# Patient Record
Sex: Female | Born: 1935 | Race: White | Hispanic: No | Marital: Married | Smoking: Never smoker
Health system: Southern US, Community
[De-identification: ages and names within clinical notes are randomized; demographics above are authoritative.]

## PROBLEM LIST (undated history)

## (undated) DIAGNOSIS — N3281 Overactive bladder: Secondary | ICD-10-CM

## (undated) DIAGNOSIS — I2510 Atherosclerotic heart disease of native coronary artery without angina pectoris: Secondary | ICD-10-CM

## (undated) DIAGNOSIS — IMO0002 Reserved for concepts with insufficient information to code with codable children: Secondary | ICD-10-CM

## (undated) DIAGNOSIS — I219 Acute myocardial infarction, unspecified: Secondary | ICD-10-CM

## (undated) DIAGNOSIS — K5730 Diverticulosis of large intestine without perforation or abscess without bleeding: Secondary | ICD-10-CM

## (undated) DIAGNOSIS — E119 Type 2 diabetes mellitus without complications: Secondary | ICD-10-CM

## (undated) DIAGNOSIS — G309 Alzheimer's disease, unspecified: Secondary | ICD-10-CM

## (undated) DIAGNOSIS — F0280 Dementia in other diseases classified elsewhere without behavioral disturbance: Secondary | ICD-10-CM

## (undated) DIAGNOSIS — I10 Essential (primary) hypertension: Secondary | ICD-10-CM

## (undated) DIAGNOSIS — E785 Hyperlipidemia, unspecified: Secondary | ICD-10-CM

## (undated) DIAGNOSIS — K222 Esophageal obstruction: Secondary | ICD-10-CM

## (undated) DIAGNOSIS — K219 Gastro-esophageal reflux disease without esophagitis: Secondary | ICD-10-CM

## (undated) HISTORY — DX: Atherosclerotic heart disease of native coronary artery without angina pectoris: I25.10

## (undated) HISTORY — DX: Hyperlipidemia, unspecified: E78.5

## (undated) HISTORY — DX: Acute myocardial infarction, unspecified: I21.9

## (undated) HISTORY — DX: Type 2 diabetes mellitus without complications: E11.9

## (undated) HISTORY — DX: Gastro-esophageal reflux disease without esophagitis: K21.9

## (undated) HISTORY — PX: BACK SURGERY: SHX140

## (undated) HISTORY — PX: APPENDECTOMY: SHX54

## (undated) HISTORY — DX: Essential (primary) hypertension: I10

## (undated) HISTORY — DX: Esophageal obstruction: K22.2

## (undated) HISTORY — DX: Reserved for concepts with insufficient information to code with codable children: IMO0002

## (undated) HISTORY — PX: OTHER SURGICAL HISTORY: SHX169

## (undated) HISTORY — PX: CHOLECYSTECTOMY: SHX55

## (undated) HISTORY — DX: Overactive bladder: N32.81

## (undated) HISTORY — DX: Diverticulosis of large intestine without perforation or abscess without bleeding: K57.30

## (undated) HISTORY — PX: BREAST BIOPSY: SHX20

## (undated) HISTORY — PX: TONSILLECTOMY AND ADENOIDECTOMY: SUR1326

---

## 1978-04-10 DIAGNOSIS — I219 Acute myocardial infarction, unspecified: Secondary | ICD-10-CM

## 1978-04-10 HISTORY — DX: Acute myocardial infarction, unspecified: I21.9

## 1997-10-13 ENCOUNTER — Other Ambulatory Visit: Admission: RE | Admit: 1997-10-13 | Discharge: 1997-10-13 | Payer: Self-pay | Admitting: Cardiology

## 1998-09-28 ENCOUNTER — Other Ambulatory Visit: Admission: RE | Admit: 1998-09-28 | Discharge: 1998-09-28 | Payer: Self-pay | Admitting: Cardiology

## 1999-12-19 ENCOUNTER — Encounter: Admission: RE | Admit: 1999-12-19 | Discharge: 1999-12-19 | Payer: Self-pay | Admitting: Internal Medicine

## 1999-12-19 ENCOUNTER — Encounter: Payer: Self-pay | Admitting: Internal Medicine

## 1999-12-23 ENCOUNTER — Encounter: Admission: RE | Admit: 1999-12-23 | Discharge: 1999-12-23 | Payer: Self-pay | Admitting: Internal Medicine

## 1999-12-23 ENCOUNTER — Encounter: Payer: Self-pay | Admitting: Internal Medicine

## 2000-12-24 ENCOUNTER — Encounter: Payer: Self-pay | Admitting: Internal Medicine

## 2000-12-24 ENCOUNTER — Encounter: Admission: RE | Admit: 2000-12-24 | Discharge: 2000-12-24 | Payer: Self-pay | Admitting: Internal Medicine

## 2001-04-04 ENCOUNTER — Encounter: Payer: Self-pay | Admitting: Orthopedic Surgery

## 2001-04-04 ENCOUNTER — Ambulatory Visit (HOSPITAL_COMMUNITY): Admission: RE | Admit: 2001-04-04 | Discharge: 2001-04-04 | Payer: Self-pay | Admitting: Orthopedic Surgery

## 2001-11-27 ENCOUNTER — Ambulatory Visit: Admission: RE | Admit: 2001-11-27 | Discharge: 2001-11-27 | Payer: Self-pay | Admitting: Internal Medicine

## 2001-12-03 ENCOUNTER — Encounter: Admission: RE | Admit: 2001-12-03 | Discharge: 2002-03-03 | Payer: Self-pay | Admitting: Internal Medicine

## 2002-01-17 ENCOUNTER — Encounter: Admission: RE | Admit: 2002-01-17 | Discharge: 2002-01-17 | Payer: Self-pay | Admitting: Internal Medicine

## 2002-01-17 ENCOUNTER — Encounter: Payer: Self-pay | Admitting: Internal Medicine

## 2002-01-28 ENCOUNTER — Encounter: Admission: RE | Admit: 2002-01-28 | Discharge: 2002-01-28 | Payer: Self-pay | Admitting: Internal Medicine

## 2002-01-28 ENCOUNTER — Encounter: Payer: Self-pay | Admitting: Internal Medicine

## 2002-01-28 ENCOUNTER — Encounter (INDEPENDENT_AMBULATORY_CARE_PROVIDER_SITE_OTHER): Payer: Self-pay | Admitting: Specialist

## 2003-02-02 ENCOUNTER — Encounter: Payer: Self-pay | Admitting: Internal Medicine

## 2003-02-02 ENCOUNTER — Encounter: Admission: RE | Admit: 2003-02-02 | Discharge: 2003-02-02 | Payer: Self-pay | Admitting: Internal Medicine

## 2003-07-31 ENCOUNTER — Inpatient Hospital Stay (HOSPITAL_COMMUNITY): Admission: RE | Admit: 2003-07-31 | Discharge: 2003-08-04 | Payer: Self-pay | Admitting: Orthopedic Surgery

## 2004-12-26 ENCOUNTER — Encounter: Admission: RE | Admit: 2004-12-26 | Discharge: 2004-12-26 | Payer: Self-pay | Admitting: Internal Medicine

## 2005-12-28 ENCOUNTER — Encounter: Admission: RE | Admit: 2005-12-28 | Discharge: 2005-12-28 | Payer: Self-pay | Admitting: Internal Medicine

## 2006-12-31 ENCOUNTER — Encounter: Admission: RE | Admit: 2006-12-31 | Discharge: 2006-12-31 | Payer: Self-pay | Admitting: Internal Medicine

## 2007-08-27 DIAGNOSIS — K649 Unspecified hemorrhoids: Secondary | ICD-10-CM | POA: Insufficient documentation

## 2007-08-27 DIAGNOSIS — K219 Gastro-esophageal reflux disease without esophagitis: Secondary | ICD-10-CM

## 2007-08-27 DIAGNOSIS — K5730 Diverticulosis of large intestine without perforation or abscess without bleeding: Secondary | ICD-10-CM

## 2007-08-27 DIAGNOSIS — K449 Diaphragmatic hernia without obstruction or gangrene: Secondary | ICD-10-CM | POA: Insufficient documentation

## 2007-08-29 ENCOUNTER — Ambulatory Visit: Payer: Self-pay | Admitting: Internal Medicine

## 2007-08-29 DIAGNOSIS — I2510 Atherosclerotic heart disease of native coronary artery without angina pectoris: Secondary | ICD-10-CM | POA: Insufficient documentation

## 2007-08-29 DIAGNOSIS — E785 Hyperlipidemia, unspecified: Secondary | ICD-10-CM | POA: Insufficient documentation

## 2007-08-29 DIAGNOSIS — R1319 Other dysphagia: Secondary | ICD-10-CM

## 2007-08-29 DIAGNOSIS — I10 Essential (primary) hypertension: Secondary | ICD-10-CM

## 2007-08-30 ENCOUNTER — Ambulatory Visit: Payer: Self-pay | Admitting: Internal Medicine

## 2008-01-01 ENCOUNTER — Encounter: Admission: RE | Admit: 2008-01-01 | Discharge: 2008-01-01 | Payer: Self-pay | Admitting: Internal Medicine

## 2009-01-05 ENCOUNTER — Encounter: Admission: RE | Admit: 2009-01-05 | Discharge: 2009-01-05 | Payer: Self-pay | Admitting: Internal Medicine

## 2009-09-21 ENCOUNTER — Ambulatory Visit (HOSPITAL_BASED_OUTPATIENT_CLINIC_OR_DEPARTMENT_OTHER): Admission: RE | Admit: 2009-09-21 | Discharge: 2009-09-21 | Payer: Self-pay | Admitting: Orthopedic Surgery

## 2009-11-10 ENCOUNTER — Ambulatory Visit: Payer: Self-pay | Admitting: Cardiology

## 2009-11-11 ENCOUNTER — Telehealth (INDEPENDENT_AMBULATORY_CARE_PROVIDER_SITE_OTHER): Payer: Self-pay | Admitting: *Deleted

## 2009-11-15 ENCOUNTER — Ambulatory Visit: Payer: Self-pay

## 2009-11-15 ENCOUNTER — Encounter (HOSPITAL_COMMUNITY): Admission: RE | Admit: 2009-11-15 | Discharge: 2009-12-27 | Payer: Self-pay | Admitting: Cardiology

## 2009-11-15 ENCOUNTER — Ambulatory Visit: Payer: Self-pay | Admitting: Internal Medicine

## 2009-11-15 ENCOUNTER — Encounter: Payer: Self-pay | Admitting: Internal Medicine

## 2010-03-14 ENCOUNTER — Encounter: Admission: RE | Admit: 2010-03-14 | Discharge: 2010-03-14 | Payer: Self-pay | Admitting: Internal Medicine

## 2010-04-30 ENCOUNTER — Encounter: Payer: Self-pay | Admitting: Internal Medicine

## 2010-05-10 NOTE — Assessment & Plan Note (Signed)
Summary: Cardiology Nuclear Testing  Nuclear Med Background Indications for Stress Test: Evaluation for Ischemia   History: Heart Catheterization, Myocardial Infarction, Myocardial Perfusion Study  History Comments: '83 MI>Cath: no record of results. 5/09 MPS:NL.  Symptoms: DOE    Nuclear Pre-Procedure Cardiac Risk Factors: Hypertension, Lipids Caffeine/Decaff Intake: none NPO After: 7:00 PM Lungs: Clear IV 0.9% NS with Angio Cath: 20g     IV Site: (R) AC IV Started by: Stanton Kidney EMT-P Chest Size (in) 34     Cup Size B     Height (in): 61 Weight (lb): 135 BMI: 25.60  Nuclear Med Study 1 or 2 day study:  1 day     Stress Test Type:  Lexiscan Low Level Reading MD:  Dietrich Pates, MD     Referring MD:  Delfin Edis Resting Radionuclide:  Technetium 79m Tetrofosmin     Resting Radionuclide Dose:  11.0 mCi  Stress Radionuclide:  Technetium 25m Tetrofosmin     Stress Radionuclide Dose:  33.0 mCi   Stress Protocol      Max HR:  110 bpm     Predicted Max HR:  145 bpm  Max Systolic BP: 185 mm Hg     Percent Max HR:  75.86 %Rate Pressure Product:  16109  Lexiscan: 0.4 mg   Stress Test Technologist:  Irean Hong RN     Nuclear Technologist:  Harlow Asa CNMT  Rest Procedure  Myocardial perfusion imaging was performed at rest 45 minutes following the intravenous administration of Myoview Technetium 69m Tetrofosmin.  Stress Procedure  The patient received IV Lexiscan 0.4 mg over 15-seconds with concurrent low level exercise, and then myoview was  injected at 30-seconds while the patient continued walking one more minute.  There were nonspecific changes with infusion.  Quantitative spect images were obtained after a 45 minute delay.  QPS Raw Data Images:  Soft tissue (diaphragm, breast) surround heart. Stress Images:  There is normal uptake in all areas. Rest Images:  Normal homogeneous uptake in all areas of the myocardium. Subtraction (SDS):  No evidence of  ischemia. Transient Ischemic Dilatation:  1.10  (Normal <1.22)  Lung/Heart Ratio:  .26  (Normal <0.45)  Quantitative Gated Spect Images QGS EDV:  49 ml QGS ESV:  12 ml QGS EF:  75 %   Overall Impression  Exercise Capacity: Lexiscan protocol BP Response: Hypertensive blood pressure response. Clinical Symptoms: Chest burning. ECG Impression: No significant ST segment change suggestive of ischemia. Overall Impression: Normal stress nuclear study.

## 2010-05-10 NOTE — Progress Notes (Signed)
Summary: Nuclear pre procedure  Phone Note Outgoing Call Call back at [facility] Phone [phone]   Call placed by: Rea College, CMA,  November 11, 2009 3:25 PM Call placed to: Patient Summary of Call: Reviewed information on Myoview Information Sheet (see scanned document for further details).  Spoke with patient.      Nuclear Med Background Indications for Stress Test: Evaluation for Ischemia   History: Heart Catheterization, Myocardial Infarction      Nuclear Pre-Procedure Cardiac Risk Factors: Hypertension, Lipids Height (in): 61

## 2010-05-18 ENCOUNTER — Encounter: Payer: Medicare Other | Admitting: Obstetrics & Gynecology

## 2010-05-18 ENCOUNTER — Other Ambulatory Visit: Payer: Self-pay | Admitting: Obstetrics & Gynecology

## 2010-05-18 DIAGNOSIS — D4959 Neoplasm of unspecified behavior of other genitourinary organ: Secondary | ICD-10-CM

## 2010-06-01 ENCOUNTER — Ambulatory Visit: Payer: Medicare Other | Admitting: Obstetrics & Gynecology

## 2010-06-27 LAB — POCT HEMOGLOBIN-HEMACUE: Hemoglobin: 14.5 g/dL (ref 12.0–15.0)

## 2010-06-27 LAB — BASIC METABOLIC PANEL
Chloride: 106 mEq/L (ref 96–112)
GFR calc Af Amer: 56 mL/min — ABNORMAL LOW (ref >60)
Potassium: 3.9 mEq/L (ref 3.5–5.1)

## 2010-07-01 NOTE — Assessment & Plan Note (Signed)
NAME:  Janet Ward, SHELBURNE NO.:  [id number]  MEDICAL RECORD NO.:  [id number]          PATIENT TYPE:  LOCATION:  [facility] [place]           FACILITY:  PHYSICIAN:  Allie Bossier, MD             DATE OF BIRTH:  DATE OF SERVICE:  05/18/2010                                 CLINIC NOTE  [name] is a 77 year old married white G2, P2, her daughters are 36 and 45 years of age, and she comes in because she has complained of at least 6 months of vaginal redness, pain, itching, burning.  She saw her primary care doctor  at least 4 months ago and he gave her prescription for Premarin cream and some other prescription that she cannot remember the name for, she says that these did not help.  She has tried Vaseline with no help.  On exam today she has multiple erythematous excoriated and even white plaque areas depending on both labia minora and even a very red area near her urethra.  The inside of the vagina shows no lesions just severe atrophy.  I cleaned the entire area with Betadine and then use 1% lidocaine to numb an area on the left labia minora and just to the right of the perineum at the vaginal introitus, these areas were both biopsied.  She tolerated the procedure well.  Silver nitrate was used cauterize both areas.  She will return in a week for her results.  In the meantime, I have given a prescription for Temovate as I believe this is probably lichen sclerosus.     Allie Bossier, MD    MCD/MEDQ  D:  05/18/2010  T:  05/19/2010  Job:  [id number]

## 2010-07-06 ENCOUNTER — Ambulatory Visit: Payer: Medicare Other | Admitting: Obstetrics & Gynecology

## 2010-07-06 DIAGNOSIS — L940 Localized scleroderma [morphea]: Secondary | ICD-10-CM

## 2010-07-12 NOTE — Assessment & Plan Note (Signed)
NAME:  Janet Ward, Janet Ward NO.:  [id number]  MEDICAL RECORD NO.:  [id number]          PATIENT TYPE:  LOCATION:  [facility] at [place]           FACILITY:  PHYSICIAN:  Allie Bossier, MD             DATE OF BIRTH:  DATE OF SERVICE:  07/06/2010                                 CLINIC NOTE  Janet Ward is a 77 year old lady who has biopsy-proven lichen simplex chronicus with spongiosis at the last time I saw her which was on May 18, 2010.  I gave her a prescription for Temovate cream and told her to use that every other night.  She has been using it every night and says that feels much much better.  On exam, there is a dramatic improvement.  She still has severe atrophy. I have recommended that she use the Temovate cream no more than twice a week and make sure she has refills.  I have also given her prescription and sample for Estrace cream to be used 0.5 gram externally twice a week as well.  She understands this.  Her mammogram is up-to-date.  She will follow up in a year or sooner as necessary.     Allie Bossier, MD    MCD/MEDQ  D:  07/06/2010  T:  07/07/2010  Job:  [id number]

## 2010-08-26 NOTE — Discharge Summary (Signed)
NAME:  Janet Ward, SPEASE                          ACCOUNT NO.:  [id number]   MEDICAL RECORD NO.:  [id number]                   PATIENT TYPE:  INP   LOCATION:  0453                                 FACILITY:  [facility]   PHYSICIAN:  Ollen Gross, M.D.                 DATE OF BIRTH:  1931-11-24   DATE OF ADMISSION:  07/31/2003  DATE OF DISCHARGE:  08/04/2003                                 DISCHARGE SUMMARY   ADMISSION DIAGNOSES:  1. Osteoarthritis, left knee.  2. Hypertension.  3. History of myocardial infarction in 1983.  4. Hiatal hernia.  5. Hypercholesterolemia.  6. Postmenopausal.   DISCHARGE DIAGNOSES:  1. Osteoarthritis, left knee status post left total knee arthroplasty.  2. Postoperative blood loss anemia.  3. Postoperative hyponatremia improved.  4. Hypertension.  5. History of myocardial infarction in 1983.  6. Hiatal hernia.  7  Hypercholesterolemia.  8  Postmenopausal.   PROCEDURE:  The patient was taken to the OR on July 31, 2003 and underwent  a left total knee arthroplasty.  Surgeon was Ollen Gross, M.D., assistant  Alexzandrew L. Julien Girt, P.A.  Anesthesia was general with postop Marcaine  pain pump. Minimal blood loss.  Hemovac drain x1.  Tourniquet time of 41  minutes at 300 mmHg.   BRIEF HISTORY:  Ms. Janet Ward is a 77 year old female with end-stage arthritis  of the left knee, pain has been  refractory to nonoperative management who  now presents for a total knee arthroplasty.   LABORATORY DATA:  CBC on admission:  Hemoglobin of 13.7, hematocrit of 40.0,  white cell count 5.__________, differential within normal limits.  Postoperative H&H 9.9 and 28.8.  Hemoglobin continued to climb down to 8.6  and 25.1.  Last noted at 8.7 and 25.2.  PT and PTT preop 11.9 and 26  respectively with an INR of 0.8, serial pro times followed.  Last noted PT  and INR 20.7 and 2.3.  Chem panel on admission minimally elevated glucose of  123, the remaining chem panel all within  normal limits. Serial BMETs are  followed. Sodium dropped from 140 down to 133, back up to 135.  Calcium  dropped to from 10.1 to 8.0 back up to 8.4.  Preop UA: Trace leukocyte  esterase, 0-2 white cells, rare bacteria, otherwise negative.  Blood group  type A positive.   EKG dated July 24, 2003, normal sinus rhythm, left atrial enlargement, no  old tracings to compare. Dr. Othelia Pulling.  Chest x-ray two view on July 24, 2003, no active pulmonary disease, mild cardiomegaly.   HOSPITAL COURSE:  The patient was admitted to [facility], taken to  the OR, underwent the above stated procedure without complications.  The  patient tolerated the procedure well and later taken to the recovery room  and then to the orthopedic floor to continue postop care.  Vital signs were  followed. The patient was  given postoperative IV antibiotics in the form of  Ancef, placed on Coumadin, started back on home medications, placed  weightbearing as tolerated.  PT and OT were consulted postop.  A Hemovac  drain placed at the time of surgery was pulled on postoperative day one.  By  day two PCs, these were discontinued.  Dressing was changed, incision was  healing well. The patient did very well with physical therapy up ambulating  approximately 190 feet x day two and up to 200 feet by day three, weaned  over to p.o. medications, progressed well with physical therapy and by day  four she was doing so well it decided she could be discharged home at that  time.   PLAN:  1. The patient was discharged on August 04, 2003.  2. Discharge diagnoses please see above.  3. Discharge medications:     a. Percocet for pain.     b. Robaxin for spasm.     c. Coumadin for DVT prophylaxis.  4. Diet, low sodium, low cholesterol diet.  5. Activity weightbearing as tolerated.  Home health PT and home health     nursing. Total knee protocol.   FOLLOW UP:  Two weeks from surgery.   DISPOSITION:  Home.   CONDITION  ON DISCHARGE:  Improved.     Alexzandrew L. Julien Girt, P.A.              Ollen Gross, M.D.    ALP/MEDQ  D:  08/26/2003  T:  08/26/2003  Job:  [id number]   cc:   Loraine Leriche A. Waynard Edwards, M.D.  [address]  [place]  [place] [address]  Fax: [phone]

## 2010-08-26 NOTE — Op Note (Signed)
NAME:  Janet Ward, Janet Ward                          ACCOUNT NO.:  [id number]   MEDICAL RECORD NO.:  [id number]                   PATIENT TYPE:  INP   LOCATION:  0001                                 FACILITY:  [facility]   PHYSICIAN:  Ollen Gross, M.D.                 DATE OF BIRTH:  1932/03/18   DATE OF PROCEDURE:  07/31/2003  DATE OF DISCHARGE:                                 OPERATIVE REPORT   PREOPERATIVE DIAGNOSES:  Osteoarthritis left knee.   POSTOPERATIVE DIAGNOSES:  Osteoarthritis left knee.   PROCEDURE:  Left total knee arthroplasty.   SURGEON:  Ollen Gross, M.D.   ASSISTANT:  Alexzandrew L. Julien Girt, P.A.   ANESTHESIA:  General with postop Marcaine pump.   ESTIMATED BLOOD LOSS:  Minimal.   DRAINS:  Hemovac x1.   TOURNIQUET TIME:  41 minutes at 300 mmHg.   COMPLICATIONS:  None.   CONDITION:  Stable to recovery.   BRIEF CLINICAL NOTE:  Janet Ward is a 77 year old female with severe end-  stage osteoarthritis of the left knee with pain refractory to nonoperative  management.  She presents now for left total knee arthroplasty.   DESCRIPTION OF PROCEDURE:  After successful administration of general  anesthetic, a tourniquet was placed on the left thigh and left lower  extremity prepped and draped in the usual sterile fashion. Extremity was  wrapped in esmarch, knee flexed and tourniquet inflated to 300 mmHg. A  midline incision was made with a 10 blade through the subcutaneous tissue to  the level of the extensor mechanism. A fresh blade is used to make a medial  parapatellar arthrotomy and the soft tissue over the proximal medial tissue  subperiosteally elevated to the joint line with a knife and into the  semimembranosus bursa with a curved osteotome.  The lateral tissue was also  elevated with attention being paid to avoiding the patellar tendon on the  tibial tubercle.  The patella was everted, knee flexed 90 degrees and ACL  and PCL removed. The drill was used to  create a starting hole in the distal  femur and canal was irrigated. A 5 degree left valgus alignment guide is  placed and the block is pinned to remove 10 mm off the distal femur.  Distal  femoral resection is made with an oscillating saw. A sizing block is placed  and a size 2.5 is the most appropriate.  The rotation for the 2.5 block is  marked up the epicondylar axis.  The 2.5 cutting guide is placed and the  anterior and posterior cuts made.   The tibia is subluxed forward and the menisci are removed.  Extramedullary  tibial alignment guide is placed referencing proximally at the medial aspect  of the tibial tubercle and distally along the second metatarsal axis and  tibial crest.  The block is pinned to remove approximately 4 mm off the  deficient lateral side. Both  sides are somewhat deficient so we went and  measured off the lower side. Tibial resection is made with an oscillating  saw.  2.5 is the most appropriate tibial component and then the proximal  tibia is prepared for a 2.5 with the modular drill and keel punch.  Femoral  preparation is completed with the intercondylar and chamfer cuts.   Trial size 2.5 posterior stabilized femur, size 2.5 mobile bearing tibial  tray, a 10 mm posterior stabilized rotating platform insert trial are  placed.  Full extension is achieved with excellent varus and valgus balance  throughout full range of motion.  The patella was again everted, thickness  measured to be 24 mm, free hand resection taken to 14 mm, 38 template is  placed, lug holes are drilled, trial patella is placed and it tracks  normally.  The osteophytes are then removed off the posterior femur with the  trial in place.  All trials are removed and the cut bone surfaces are  prepared with pulsatile lavage.  The cement is mixed and once ready for  implantation, a size 2.5 mobile bearing tibial tray, size 2.5 posterior  stabilized femur and the 10 mm trial insert is placed. The 38  patella is  also placed and held with the clamp.  Once the cement is fully hardened then  the permanent 10 mm posterior stabilized rotating platform insert is placed  into the tibial tray.  The wound is copiously irrigated with antibiotic  solution and the extensor mechanism closed over a Hemovac drain with  interrupted #1 PDS.  The subcu is closed with interrupted 2-0 Vicryl.  Tourniquet is released with a total time of 41 minutes. Flexion against  gravity is 135 degrees. The subcu is closed with interrupted 2-0 Vicryl,  subcuticular with running 4-0 Monocryl. The catheter for the Marcaine pain  pump is placed.  Steri-Strips and a bulky sterile dressing applied. The pump  is hooked up.  The knee is then placed into a knee immobilizer and she is  awakened and transported to recovery in stable condition.                                               Ollen Gross, M.D.    FA/MEDQ  D:  07/31/2003  T:  08/01/2003  Job:  [id number]

## 2010-08-26 NOTE — H&P (Signed)
NAME:  Janet Ward, GUEDES                          ACCOUNT NO.:  [id number]   MEDICAL RECORD NO.:  [id number]                   PATIENT TYPE:  INP   LOCATION:  0453                                 FACILITY:  [facility]   PHYSICIAN:  Ollen Gross, M.D.                 DATE OF BIRTH:  Aug 05, 1931   DATE OF ADMISSION:  07/31/2003  DATE OF DISCHARGE:  08/04/2003                                HISTORY & PHYSICAL   CHIEF COMPLAINT:  Left knee pain.   HISTORY OF PRESENT ILLNESS:  The patient is a 77 year old female seen by Dr.  Lequita Halt for severe left knee pain.  Pain has been ongoing for several years  now.  She had an arthroscopy in September 2003, per Dr. Darrelyn Hillock.  Noted to  have degenerative changes.  She has undergone Cortisone injections over the  past year, and only minimal improvement.  She is seen in followup by Dr.  Lequita Halt and has had progression to the point where she has significant bone-  on-bone changes in the left knee.  It is felt she has reached a point where  she could benefit from undergoing knee replacement.  Risks and benefits  discussed.  The patient is subsequently admitted to the hospital.   ALLERGIES:  1. SULFA causes a rash.  2. There are some generic medications that she has a reaction too.   CURRENT MEDICATIONS:  1. Diovan 160 mg daily.  2. Atenolol 50 mg daily.  3. Hydrochlorate 25 mg Monday through Friday only.  4. Zetia 10 mg daily.  5. Welchol 625 mg three tabs a day.  6. Baby aspirin stopped prior to surgery.  7. Flax seed oil 1000 mg.  8. Centrum Silver daily.  9. Tums daily.  10.      Prevacid p.r.n.  11.      Mobic 15 mg daily p.r.n.  12.      Flonase p.r.n.   PAST MEDICAL HISTORY:  1. Hypercholesterolemia.  2. Hypertension.  3. History of myocardial infarction in April 1983.  4. Hiatal hernia.  5. Gallstones.  6. Postmenopausal.   PAST SURGICAL HISTORY:  1. Gallbladder surgery.  2. Tonsillectomy.  3. Apparently had a cardiac catheterization  associated with her heart attack     in 1983.  4. Back surgery.  5. Breast biopsy which was benign in 2003.   SOCIAL HISTORY:  Married, retired Runner, broadcasting/film/video, nonsmoker, occasional wine.  Has  two daughters.  Lives in a town home, 13 steps going up a flight of stairs.   FAMILY HISTORY:  Mother with a history of heart disease and hypertension,  deceased at age 42.  Brother with heart disease.  Colon cancer with father.   REVIEW OF SYSTEMS:  GENERAL:  No fever, chills, night sweats.  NEUROLOGIC:  No seizures, syncope, paralysis.  RESPIRATORY:  No shortness of breath,  productive cough, or hemoptysis.  CARDIOVASCULAR:  No chest  pain, angina,  orthopnea.  GASTROINTESTINAL:  No nausea, vomiting, diarrhea, constipation.  GENITOURINARY:  No dysuria, hematuria, discharge.  MUSCULOSKELETAL:  Pertinent to that of the knee found in the history of present illness.   PHYSICAL EXAMINATION:  VITAL SIGNS:  Pulse 72, respirations 14, blood  pressure 142/68.  GENERAL:  A 77 year old white female, well-developed, well-nourished, in no  acute distress.  Alert, oriented, and cooperative.  Pleasant at time of  examination.  HEENT:  Normocephalic, atraumatic.  Pupils equal, round, reactive.  Oropharynx is clear.  EOMs intact.  NECK:  Supple, no carotid bruits.  CHEST:  Clear anterior and posterior chest walls.  HEART:  Regular rate and rhythm.  No murmurs.  ABDOMEN:  Soft, nontender, bowel sounds present.  RECTAL:  Not done, not pertinent to present illness.  BREASTS:  Not done, not pertinent to present illness.  GENITOURINARY:  Not done, not pertinent to present illness.  EXTREMITIES:  Significant to that of the left knee.  There is no effusion,  range of motion of 5 to 110 degrees, moderate crepitus, no instability.  Right knee shows range of motion of 0 to 125 degrees, no effusion.   IMPRESSION:  1. Osteoarthritis, left knee.  2. Hypertension.  3. History of myocardial infarction in 1983.  4. Hiatal  hernia.  5. Hypercholesterolemia.  6. Postmenopausal.   PLAN:  The patient will be admitted to [facility] to undergo a  left total knee arthroplasty.  Surgery will be performed by Dr. Ollen Gross.     Alexzandrew L. Julien Girt, P.A.              Ollen Gross, M.D.   ALP/MEDQ  D:  08/07/2003  T:  08/07/2003  Job:  [id number]   cc:   Loraine Leriche A. Waynard Edwards, M.D.  [address]  [place]  [place] [address]  Fax: [phone]

## 2010-09-08 ENCOUNTER — Telehealth: Payer: Self-pay | Admitting: Cardiology

## 2010-09-08 NOTE — Telephone Encounter (Signed)
Called wanting to discuss the new doctor she has been referred to with you. Please call back. She will be home for the rest of the afternoon.

## 2010-09-08 NOTE — Telephone Encounter (Signed)
Set pt up to f/u with Dr. Marca Ancona on 11/15/10 at 930.  Pt aware of appointment.

## 2010-10-04 ENCOUNTER — Encounter: Payer: Self-pay | Admitting: Cardiology

## 2010-11-15 ENCOUNTER — Ambulatory Visit: Payer: BC Managed Care – PPO | Admitting: Cardiology

## 2010-12-13 ENCOUNTER — Encounter: Payer: Self-pay | Admitting: Cardiology

## 2010-12-13 ENCOUNTER — Ambulatory Visit (INDEPENDENT_AMBULATORY_CARE_PROVIDER_SITE_OTHER): Payer: Medicare Other | Admitting: Cardiology

## 2010-12-13 VITALS — BP 136/73 | HR 63 | Ht 61.0 in | Wt 137.0 lb

## 2010-12-13 DIAGNOSIS — I10 Essential (primary) hypertension: Secondary | ICD-10-CM

## 2010-12-13 DIAGNOSIS — E785 Hyperlipidemia, unspecified: Secondary | ICD-10-CM

## 2010-12-13 DIAGNOSIS — I2510 Atherosclerotic heart disease of native coronary artery without angina pectoris: Secondary | ICD-10-CM

## 2010-12-13 NOTE — Patient Instructions (Addendum)
Your physician recommends that you return for a FASTING lipid profile/liver profile/BMP 414.01  Your physician wants you to follow-up in: 1 year with Dr Shirlee Latch.(September 2013).You will receive a reminder letter in the mail two months in advance. If you don't receive a letter, please call our office to schedule the follow-up appointment.

## 2010-12-14 NOTE — Assessment & Plan Note (Signed)
BP is under good control on current regimen.

## 2010-12-14 NOTE — Assessment & Plan Note (Signed)
MI by history in 1983 with no recurrence.  Normal myoview in 8/11.  No ischemic symptoms.  She is doing well.  I will have her continue ASA 81, beta blocker, ARB, and statin.

## 2010-12-14 NOTE — Assessment & Plan Note (Signed)
She is due for repeat lipid evaluation (last she remembers was in 2011).  Will draw lipids with goal LDL < 70.  She has tolerated Crestor 5 mg 3 x a week without problems.

## 2010-12-14 NOTE — Progress Notes (Signed)
PCP: Dr. Waynard Edwards  77 yo with history of CAD s/p MI in 1983 presents for cardiology followup.  Patient has been seen by Dr. Deborah Chalk in the past and is seen by me for the first time today.  Patient had an episode of chest heaviness lasting all day back in 1983.  She went to the hospital and was told she had had a heart attack based on her ECG.  She did not have angiography.  She has not had any recent chest discomfort.  Her last stress test was a Lexiscan myoview in 8/11 that showed normal LV systolic function and no evidence for ischemia or infarction.  She has HTN that has been under reasonable control.  She has had myalgias with statins in the past but is tolerating a low dose of Crestor.   She is reasonably active and denies exertional dyspnea.  No episodes of presyncope/syncope.    ECG: NSR, normal  PMH: 1. CAD: MI in 1983 when she was in her 17s.  ECG and symptoms suggestive of MI at that time, did not have angiography.  No recurrence.  Has never had left heart cath.  Lexiscan myoview (8/11) showed EF 75%, no evidence of ischemia or infarction.  2. Spinal stenosis: has had epidural steroid shot in past.  3. Left THR in 4/05 4. Hyperlipidemia: statin-induced myalgias, has tolerated low dose Crestor.  5. HTN 6. Impaired fasting glucose  SH: Married (1st husband died then remarried).  Lives in [place].  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   FH: Mother with cerebral hemorrhage. Brother with MI in his 4s.  Multiple family members with MIs on her mother's side.   ROS: All systems reviewed and negative except as per HPI.   Current Outpatient Prescriptions  Medication Sig Dispense Refill  . aspirin 81 MG tablet Take 81 mg by mouth daily.        Marland Kitchen atenolol (TENORMIN) 50 MG tablet Take 50 mg by mouth daily.        . calcium carbonate (TUMS) 500 MG chewable tablet Chew 1 tablet by mouth daily.        Marland Kitchen ezetimibe (ZETIA) 10 MG tablet Take 10 mg by mouth daily.        .  hydrochlorothiazide 25 MG tablet Take 25 mg by mouth. Pt takes one tablet daily Mon-Fri      . Multiple Vitamins-Minerals (ONE-A-DAY EXTRAS ANTIOXIDANT) CAPS Take by mouth daily.        . Omega-3 Fatty Acids (FISH OIL) 1000 MG CAPS Take 3 capsules by mouth 2 (two) times daily.        . rosuvastatin (CRESTOR) 10 MG tablet Take 5 mg by mouth 2 (two) times a week.        . valsartan (DIOVAN) 160 MG tablet Take 160 mg by mouth daily.        Marland Kitchen VITAMIN D, CHOLECALCIFEROL, PO Take by mouth daily.          BP 136/73  Pulse 63  Ht 5\' 1"  (1.549 m)  Wt 137 lb (62.143 kg)  BMI 25.89 kg/m2 General: NAD Neck: No JVD, no thyromegaly or thyroid nodule.  Lungs: Clear to auscultation bilaterally with normal respiratory effort. CV: Nondisplaced PMI.  Heart regular S1/S2, no S3/S4, no murmur.  No peripheral edema.  No carotid bruit.  Normal pedal pulses.  Bilateral lower leg varicosities.  Abdomen: Soft, nontender, no hepatosplenomegaly, no distention.  Neurologic: Alert and oriented x 3.  Psych: Normal affect. Extremities:  No clubbing or cyanosis.

## 2010-12-19 ENCOUNTER — Other Ambulatory Visit (INDEPENDENT_AMBULATORY_CARE_PROVIDER_SITE_OTHER): Payer: Medicare Other | Admitting: *Deleted

## 2010-12-19 DIAGNOSIS — I2510 Atherosclerotic heart disease of native coronary artery without angina pectoris: Secondary | ICD-10-CM

## 2010-12-19 LAB — HEPATIC FUNCTION PANEL
ALT: 17 U/L (ref 0–35)
Bilirubin, Direct: 0.1 mg/dL (ref 0.0–0.3)
Total Protein: 6.4 g/dL (ref 6.0–8.3)

## 2010-12-19 LAB — BASIC METABOLIC PANEL
BUN: 29 mg/dL — ABNORMAL HIGH (ref 6–23)
GFR: 46.74 mL/min — ABNORMAL LOW (ref >60.00)
Potassium: 4.2 mEq/L (ref 3.5–5.1)

## 2010-12-19 LAB — LIPID PANEL
Cholesterol: 127 mg/dL (ref 0–200)
HDL: 56.6 mg/dL (ref >39.00)
VLDL: 10.8 mg/dL (ref 0.0–40.0)

## 2011-02-09 ENCOUNTER — Other Ambulatory Visit: Payer: Self-pay | Admitting: Internal Medicine

## 2011-02-09 DIAGNOSIS — Z1231 Encounter for screening mammogram for malignant neoplasm of breast: Secondary | ICD-10-CM

## 2011-03-16 ENCOUNTER — Ambulatory Visit
Admission: RE | Admit: 2011-03-16 | Discharge: 2011-03-16 | Disposition: A | Discharge status: Home / Self Care | Payer: Medicare Other | Source: Ambulatory Visit | Attending: Internal Medicine | Admitting: Internal Medicine

## 2011-03-16 DIAGNOSIS — Z1231 Encounter for screening mammogram for malignant neoplasm of breast: Secondary | ICD-10-CM

## 2011-12-19 ENCOUNTER — Other Ambulatory Visit (INDEPENDENT_AMBULATORY_CARE_PROVIDER_SITE_OTHER): Payer: Medicare Other

## 2011-12-19 ENCOUNTER — Other Ambulatory Visit: Payer: Self-pay | Admitting: *Deleted

## 2011-12-19 DIAGNOSIS — I2510 Atherosclerotic heart disease of native coronary artery without angina pectoris: Secondary | ICD-10-CM

## 2011-12-19 LAB — LIPID PANEL
Cholesterol: 125 mg/dL (ref 0–200)
HDL: 42.6 mg/dL (ref >39.00)
Total CHOL/HDL Ratio: 3
Triglycerides: 149 mg/dL (ref 0.0–149.0)

## 2011-12-19 LAB — BASIC METABOLIC PANEL
CO2: 27 mEq/L (ref 19–32)
Calcium: 9.4 mg/dL (ref 8.4–10.5)
GFR: 60.46 mL/min (ref >60.00)
Sodium: 137 mEq/L (ref 135–145)

## 2012-01-08 ENCOUNTER — Ambulatory Visit: Payer: Medicare Other | Admitting: Cardiology

## 2012-01-18 ENCOUNTER — Ambulatory Visit (INDEPENDENT_AMBULATORY_CARE_PROVIDER_SITE_OTHER): Payer: Medicare Other | Admitting: Cardiology

## 2012-01-18 ENCOUNTER — Encounter: Payer: Self-pay | Admitting: Cardiology

## 2012-01-18 VITALS — BP 128/64 | HR 64 | Ht 61.0 in | Wt 135.0 lb

## 2012-01-18 DIAGNOSIS — E785 Hyperlipidemia, unspecified: Secondary | ICD-10-CM

## 2012-01-18 DIAGNOSIS — I2510 Atherosclerotic heart disease of native coronary artery without angina pectoris: Secondary | ICD-10-CM

## 2012-01-18 DIAGNOSIS — I10 Essential (primary) hypertension: Secondary | ICD-10-CM

## 2012-01-18 NOTE — Patient Instructions (Addendum)
Your physician wants you to follow-up in: 1 year with Dr Shirlee Latch. (October 2014). You will receive a reminder letter in the mail two months in advance. If you don't receive a letter, please call our office to schedule the follow-up appointment.   Your physician recommends that you return for a FASTING lipid profile /BMET in 1 year a few days before you see Dr Shirlee Latch in October 2014.

## 2012-01-19 NOTE — Progress Notes (Signed)
Patient ID: Janet Ward, female   DOB: 14-Aug-1931, 78 y.o.   MRN: [id number] PCP: Dr. Waynard Edwards  78 yo with history of CAD s/p MI in 1983 presents for cardiology followup.  Patient had an episode of chest heaviness lasting all day back in 1983.  She went to the hospital and was told she had had a heart attack based on her ECG.  She did not have angiography.  She has not had any recent chest discomfort.  Her last stress test was a Lexiscan myoview in 8/11 that showed normal LV systolic function and no evidence for ischemia or infarction.  She has HTN that has been under reasonable control.  She has had myalgias with statins in the past but is tolerating a low dose of Crestor.   She is reasonably active and denies exertional dyspnea.  No episodes of presyncope/syncope.  She plans to start working out at a gym via the [employer].   ECG: NSR, normal  Labs (9/13): LDL 53, HDL 43, K 3.9, creatinine 1.0  PMH: 1. CAD: MI in 1983 when she was in her 64s.  ECG and symptoms suggestive of MI at that time, did not have angiography.  No recurrence.  Has never had left heart cath.  Lexiscan myoview (8/11) showed EF 75%, no evidence of ischemia or infarction.  2. Spinal stenosis: has had epidural steroid shot in past.  3. Left THR in 4/05 4. Hyperlipidemia: statin-induced myalgias, has tolerated low dose Crestor.  5. HTN 6. Impaired fasting glucose  SH: Married (1st husband died then remarried).  Lives in [place].  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   FH: Mother with cerebral hemorrhage. Brother with MI in his 30s.  Multiple family members with MIs on her mother's side.    Current Outpatient Prescriptions  Medication Sig Dispense Refill  . aspirin 81 MG tablet Take 81 mg by mouth daily.        Marland Kitchen atenolol (TENORMIN) 50 MG tablet Take 50 mg by mouth daily.        . calcium carbonate (TUMS) 500 MG chewable tablet Chew 1 tablet by mouth daily.        Marland Kitchen CINNAMON PO Take 2,000 mg  by mouth daily.      . Cyanocobalamin (VITAMIN B-12) 2500 MCG SUBL Place under the tongue daily.      Marland Kitchen ezetimibe (ZETIA) 10 MG tablet Take 10 mg by mouth daily.        . fluticasone (FLONASE) 50 MCG/ACT nasal spray as needed.      . hydrochlorothiazide 25 MG tablet Take 25 mg by mouth. Pt takes one tablet daily Mon-Fri      . Omega-3 Fatty Acids (FISH OIL) 1000 MG CAPS Take 3 capsules by mouth 2 (two) times daily.        . rosuvastatin (CRESTOR) 10 MG tablet Take 5 mg by mouth 3 (three) times a week.       . valsartan (DIOVAN) 160 MG tablet Take 160 mg by mouth daily.        Marland Kitchen VITAMIN D, CHOLECALCIFEROL, PO Take by mouth daily.          BP 128/64  Pulse 64  Ht 5\' 1"  (1.549 m)  Wt 135 lb (61.236 kg)  BMI 25.51 kg/m2 General: NAD Neck: No JVD, no thyromegaly or thyroid nodule.  Lungs: Clear to auscultation bilaterally with normal respiratory effort. CV: Nondisplaced PMI.  Heart regular S1/S2, no S3/S4, no murmur.  No  peripheral edema.  No carotid bruit.  Normal pedal pulses.  Bilateral lower leg varicosities.  Abdomen: Soft, nontender, no hepatosplenomegaly, no distention.  Neurologic: Alert and oriented x 3.  Psych: Normal affect. Extremities: No clubbing or cyanosis.   Assessment/Plan:  CORONARY ARTERY DISEASE MI by history in 1983 with no recurrence. Interestingly, she reports significant stress at the time of the event, and I wonder if it was not actually a Takotsubo-type event.  Normal myoview in 8/11. No ischemic symptoms. She is doing well. I will have her continue ASA 81, beta blocker, ARB, and statin.  HYPERTENSION  BP is under good control on current regimen.  HYPERLIPIDEMIA  Excellent lipids in 9/13.   Amen Staszak [employer]

## 2012-02-08 ENCOUNTER — Other Ambulatory Visit: Payer: Self-pay | Admitting: Internal Medicine

## 2012-02-08 DIAGNOSIS — Z1231 Encounter for screening mammogram for malignant neoplasm of breast: Secondary | ICD-10-CM

## 2012-03-18 ENCOUNTER — Ambulatory Visit
Admission: RE | Admit: 2012-03-18 | Discharge: 2012-03-18 | Disposition: A | Discharge status: Home / Self Care | Payer: Medicare Other | Source: Ambulatory Visit | Attending: Internal Medicine | Admitting: Internal Medicine

## 2012-03-18 DIAGNOSIS — Z1231 Encounter for screening mammogram for malignant neoplasm of breast: Secondary | ICD-10-CM

## 2012-07-04 ENCOUNTER — Encounter: Payer: Self-pay | Admitting: Cardiology

## 2012-07-11 ENCOUNTER — Other Ambulatory Visit: Payer: Self-pay | Admitting: Internal Medicine

## 2012-07-11 DIAGNOSIS — R109 Unspecified abdominal pain: Secondary | ICD-10-CM

## 2012-07-18 ENCOUNTER — Ambulatory Visit
Admission: RE | Admit: 2012-07-18 | Discharge: 2012-07-18 | Disposition: A | Discharge status: Home / Self Care | Payer: Medicare Other | Source: Ambulatory Visit | Attending: Internal Medicine | Admitting: Internal Medicine

## 2012-07-18 DIAGNOSIS — R109 Unspecified abdominal pain: Secondary | ICD-10-CM

## 2012-07-18 MED ORDER — IOHEXOL 300 MG/ML  SOLN
100.0000 mL | Freq: Once | INTRAMUSCULAR | Status: AC | PRN
  Administered 2012-07-18: 100 mL via INTRAVENOUS

## 2012-11-12 ENCOUNTER — Telehealth: Payer: Self-pay | Admitting: Internal Medicine

## 2012-11-12 NOTE — Telephone Encounter (Signed)
Pt scheduled to see Willette Cluster NP Thursday 11/14/12@11am . Malachi Bonds to fax records and notify pt of appt date and time.

## 2012-11-14 ENCOUNTER — Ambulatory Visit: Payer: Medicare Other | Admitting: Nurse Practitioner

## 2012-11-22 ENCOUNTER — Encounter: Payer: Self-pay | Admitting: Internal Medicine

## 2012-11-22 ENCOUNTER — Encounter: Payer: Self-pay | Admitting: Physician Assistant

## 2012-11-22 ENCOUNTER — Ambulatory Visit (INDEPENDENT_AMBULATORY_CARE_PROVIDER_SITE_OTHER): Payer: Medicare Other | Admitting: Physician Assistant

## 2012-11-22 VITALS — BP 110/60 | HR 76 | Ht 61.0 in | Wt 129.5 lb

## 2012-11-22 DIAGNOSIS — R109 Unspecified abdominal pain: Secondary | ICD-10-CM

## 2012-11-22 DIAGNOSIS — Z800 Family history of malignant neoplasm of digestive organs: Secondary | ICD-10-CM

## 2012-11-22 MED ORDER — MOVIPREP 100 G PO SOLR: 1.0000 | Freq: Once | ORAL | Status: DC

## 2012-11-22 NOTE — Progress Notes (Signed)
Subjective:    Patient ID: Janet Ward, female    DOB: [birth year], 79 y.o.   MRN: [id number]  HPI  Cherelle is a pleasant 62 H. or old white female known to Dr. Yancey Flemings from previous endoscopy done in 2009. She was found to have a distal esophageal stricture which was dilated and GERD. She also had colonoscopy done in September of 2005 showing right and left-sided diverticulosis and hemorrhoids. She is referred back today for evaluation of left-sided abdominal pain. Patient states she's been having pain for about 4 months but says it she would describe this more as a soreness than the pain. She says it is only present when she first placed down at night when she first gets up in the morning. She says the pain does not persist at nighttime and is not keeping her from sleeping. And she says once she is up and moving in the morning she does not filled pain during the day . She has had problems with her back but does not feel that her pain is originating from her back. She's not had any recent injury. Her appetite has been fine her weight has been stable she has no complaints of nausea heartburn or indigestion no dysphagia. She does have some mild constipation problems has not noted any melena or hematochezia. She mentions that her husband has been diagnosed with lung cancer and is currently completing radiation  Patient had a CT scan of the abdomen and pelvis done in April of 2014 for the same left-sided abdominal pain and was found to have diverticulosis, and surgically absent gallbladder otherwise negative.    Review of Systems  Constitutional: Negative.   Eyes: Negative.   Cardiovascular: Negative.   Gastrointestinal: Positive for abdominal pain.  Endocrine: Negative.   Genitourinary: Negative.   Musculoskeletal: Negative.   Skin: Negative.   Allergic/Immunologic: Negative.   Neurological: Negative.   Hematological: Negative.   Psychiatric/Behavioral: Negative.    Outpatient  Prescriptions Prior to Visit  Medication Sig Dispense Refill  . aspirin 81 MG tablet Take 81 mg by mouth daily.        Marland Kitchen atenolol (TENORMIN) 50 MG tablet Take 50 mg by mouth daily.        . calcium carbonate (TUMS) 500 MG chewable tablet Chew 1 tablet by mouth daily.        Marland Kitchen CINNAMON PO Take 2,000 mg by mouth daily.      . Cyanocobalamin (VITAMIN B-12) 2500 MCG SUBL Place under the tongue daily.      Marland Kitchen ezetimibe (ZETIA) 10 MG tablet Take 10 mg by mouth daily.        . fluticasone (FLONASE) 50 MCG/ACT nasal spray as needed.      . hydrochlorothiazide 25 MG tablet Take 25 mg by mouth. Pt takes one tablet daily Mon-Fri      . Omega-3 Fatty Acids (FISH OIL) 1000 MG CAPS Take 3 capsules by mouth 2 (two) times daily.        . rosuvastatin (CRESTOR) 10 MG tablet Take 5 mg by mouth 3 (three) times a week.       . valsartan (DIOVAN) 160 MG tablet Take 160 mg by mouth daily.        Marland Kitchen VITAMIN D, CHOLECALCIFEROL, PO Take by mouth daily.         No facility-administered medications prior to visit.   Allergies  Allergen Reactions  . Penicillins   . Sulfonamide Derivatives    Patient Active Problem  List   Diagnosis Date Noted  . HYPERLIPIDEMIA 08/29/2007  . HYPERTENSION 08/29/2007  . CORONARY ARTERY DISEASE 08/29/2007  . DYSPHAGIA 08/29/2007  . HEMORRHOIDS 08/27/2007  . GERD 08/27/2007  . HIATAL HERNIA 08/27/2007  . DIVERTICULOSIS, COLON 08/27/2007   History  Substance Use Topics  . Smoking status: Never Smoker   . Smokeless tobacco: Never Used  . Alcohol Use: Yes     Comment: occasionally drinks wine   family history includes Cancer in her father; Diabetes in her brother; Heart disease in her mother; Hypertension in her brother.     Objective:   Physical Exam well-developed older white female in no acute distress, pleasant blood pressure 110/60 pulse 76 height 5 foot 1 weight 129. HEENT; nontraumatic normocephalic EOMI PERRLA sclera anicteric, Supple; no JVD, Cardiovascular; regular  rate and rhythm with S1-S2 no murmur or gallop, Pulmonary; clear bilaterally, Abdomen ;soft nontender nondistended bowel sounds are active there is no palpable mass or hepatosplenomegaly, Rectal ;exam not done, Extremity ;no clubbing cyanosis or edema skin warm and dry, Psych; mood and affect normal and appropriate        Assessment & Plan:  #74  79 year old female with a several month history of left-sided abdominal soreness primarily left upper quadrant. She has had a negative CT and is nontender on exam. Etiology of her pain is not clear. This may be musculoskeletal however cannot rule out occult colonic source #2 family history of colon cancer the patient's father #3 diverticulosis #4 status post cholecystectomy #5 GERD #6 coronary artery disease #7hypertension  Plan;  Will schedule for colonoscopy with Dr. Marina Goodell. Procedure discussed in detail with the patient and she is agreeable to proceed. Further plans based on findings at colonoscopy

## 2012-11-22 NOTE — Patient Instructions (Addendum)
You have been scheduled for a colonoscopy with propofol. Please follow written instructions given to you at your visit today.  Please pick up your prep kit at the pharmacy within the next 1-3 days. Pharmacy [facility] [address] If you use inhalers (even only as needed), please bring them with you on the day of your procedure. Your physician has requested that you go to www.startemmi.com and enter the access code given to you at your visit today. This web site gives a general overview about your procedure. However, you should still follow specific instructions given to you by our office regarding your preparation for the procedure.

## 2012-11-25 NOTE — Progress Notes (Signed)
Agree with initial assessment and plans 

## 2012-11-28 ENCOUNTER — Encounter: Payer: Medicare Other | Admitting: Internal Medicine

## 2012-12-10 ENCOUNTER — Ambulatory Visit (AMBULATORY_SURGERY_CENTER): Payer: Medicare Other | Admitting: Internal Medicine

## 2012-12-10 ENCOUNTER — Encounter: Payer: Self-pay | Admitting: Internal Medicine

## 2012-12-10 VITALS — BP 114/67 | HR 56 | Temp 96.4°F | Resp 19 | Ht 61.0 in | Wt 129.0 lb

## 2012-12-10 DIAGNOSIS — Z800 Family history of malignant neoplasm of digestive organs: Secondary | ICD-10-CM

## 2012-12-10 DIAGNOSIS — Z1211 Encounter for screening for malignant neoplasm of colon: Secondary | ICD-10-CM

## 2012-12-10 DIAGNOSIS — K5730 Diverticulosis of large intestine without perforation or abscess without bleeding: Secondary | ICD-10-CM

## 2012-12-10 MED ORDER — SODIUM CHLORIDE 0.9 % IV SOLN: 500.0000 mL | INTRAVENOUS | Status: DC

## 2012-12-10 NOTE — Progress Notes (Signed)
Patient did not experience any of the following events: a burn prior to discharge; a fall within the facility; wrong site/side/patient/procedure/implant event; or a hospital transfer or hospital admission upon discharge from the facility. (G8907)  

## 2012-12-10 NOTE — Progress Notes (Signed)
Report toi pacu rn, vss, bbs=clear

## 2012-12-10 NOTE — Op Note (Signed)
Janet Ward [address] [place], [address]   COLONOSCOPY PROCEDURE REPORT  PATIENT: Janet, Ward  MR#: [id number] BIRTHDATE: 1931-11-25 , 81  yrs. old GENDER: Female ENDOSCOPIST: Roxy Cedar, MD REFERRED XB:JYNW Perini, M.D. PROCEDURE DATE:  12/10/2012 PROCEDURE:   Colonoscopy, screening First Screening Colonoscopy - Avg.  risk and is 50 yrs.  old or older - No.  Prior Negative Screening - Now for repeat screening. 10 or more years since last screening  History of Adenoma - Now for follow-up colonoscopy & has been > or = to 3 yrs.  N/A  Polyps Removed Today? No.  Recommend repeat exam, <10 yrs? No. ASA CLASS:   Class II INDICATIONS:Patient's immediate family history of colon cancer (Parent 75).   Seen in office for chronic left sided pain. Last colon 2005 with diverticulosis MEDICATIONS: MAC sedation, administered by CRNA and propofol (Diprivan) 200mg  IV  DESCRIPTION OF PROCEDURE:   After the risks benefits and alternatives of the procedure were thoroughly explained, informed consent was obtained.  A digital rectal exam revealed no abnormalities of the rectum.   The [id number]  endoscope was introduced through the anus and advanced to the cecum, which was identified by both the appendix and ileocecal valve. No adverse events experienced.   The quality of the prep was excellent, using MoviPrep  The instrument was then slowly withdrawn as the colon was fully examined.  COLON FINDINGS: Several nonbleeding AVM's at the cecum.   Moderate diverticulosis was noted throughout the entire examined colon. The colon mucosa was otherwise normal.  Retroflexed views revealed internal hemorrhoids. The time to cecum=3 minutes 44 seconds. Withdrawal time=7 minutes 01 seconds.  The scope was withdrawn and the procedure completed. COMPLICATIONS: There were no complications.  ENDOSCOPIC IMPRESSION: 1.   Incidental cecal AVM's 2.   Moderate diverticulosis  was noted throughout the entire examined colon 3.   The colon mucosa was otherwise normal  RECOMMENDATIONS: 1. No GI cause for left sided discomfort found or suspected. Return to the care of your primary provider.  GI follow up as needed   eSigned:  Roxy Cedar, MD 12/10/2012 11:18 AM   cc: Rodrigo Ran, MD and The Patient   PATIENT NAME:  Janet Ward, Janet Ward MR#: [id number]

## 2012-12-10 NOTE — Patient Instructions (Addendum)

## 2012-12-11 ENCOUNTER — Telehealth: Payer: Self-pay | Admitting: *Deleted

## 2012-12-11 NOTE — Telephone Encounter (Signed)
  Follow up Call-  Call back number 12/10/2012  Post procedure Call Back phone  # [phone]  Permission to leave phone message Yes     Patient questions:  Do you have a fever, pain , or abdominal swelling? no Pain Score  0 *  Have you tolerated food without any problems? yes  Have you been able to return to your normal activities? yes  Do you have any questions about your discharge instructions: Diet   no Medications  no Follow up visit  no  Do you have questions or concerns about your Care? no  Actions: * If pain score is 4 or above: No action needed, pain <4.

## 2013-01-08 ENCOUNTER — Other Ambulatory Visit: Payer: Medicare Other

## 2013-01-09 ENCOUNTER — Other Ambulatory Visit: Payer: Medicare Other

## 2013-01-20 ENCOUNTER — Ambulatory Visit: Payer: Medicare Other | Admitting: Cardiology

## 2013-01-28 ENCOUNTER — Ambulatory Visit (INDEPENDENT_AMBULATORY_CARE_PROVIDER_SITE_OTHER): Payer: Medicare Other | Admitting: Cardiology

## 2013-01-28 ENCOUNTER — Encounter: Payer: Self-pay | Admitting: Cardiology

## 2013-01-28 VITALS — BP 150/80 | HR 60 | Ht 61.0 in | Wt 131.0 lb

## 2013-01-28 DIAGNOSIS — E785 Hyperlipidemia, unspecified: Secondary | ICD-10-CM

## 2013-01-28 DIAGNOSIS — I10 Essential (primary) hypertension: Secondary | ICD-10-CM

## 2013-01-28 DIAGNOSIS — I2510 Atherosclerotic heart disease of native coronary artery without angina pectoris: Secondary | ICD-10-CM

## 2013-01-28 LAB — LIPID PANEL
Total CHOL/HDL Ratio: 2
Triglycerides: 109 mg/dL (ref 0.0–149.0)

## 2013-01-28 LAB — BASIC METABOLIC PANEL
CO2: 28 mEq/L (ref 19–32)
Calcium: 9.5 mg/dL (ref 8.4–10.5)
Creatinine, Ser: 1.1 mg/dL (ref 0.4–1.2)

## 2013-01-28 NOTE — Patient Instructions (Signed)
Your physician recommends that you have  a FASTING lipid profile /BMET today.  Your physician wants you to follow-up in: 1 year with Dr Shirlee Latch. (October 2015).  You will receive a reminder letter in the mail two months in advance. If you don't receive a letter, please call our office to schedule the follow-up appointment.

## 2013-01-28 NOTE — Progress Notes (Signed)
Patient ID: Janet Ward, female   DOB: [birth year], 79 y.o.   MRN: [id number] PCP: Dr. Waynard Edwards  79 yo with history of CAD s/p MI in 1983 presents for cardiology followup.  Patient had an episode of chest heaviness lasting all day back in 1983.  She went to the hospital and was told she had had a heart attack based on her ECG.  She did not have angiography.  She has not had any recent chest discomfort.  Her last stress test was a Lexiscan myoview in 8/11 that showed normal LV systolic function and no evidence for ischemia or infarction.  She has HTN that has been under reasonable control.  BP is a bit high today but she has been under a lot of stress.  Her husband is getting chemotherapy for lung cancer and is currently an inpatient at [facility].  She has had myalgias with statins in the past but is tolerating a low dose of Crestor.   She is reasonably active and denies exertional dyspnea.  No episodes of presyncope/syncope.    ECG: NSR, normal  Labs (9/13): LDL 53, HDL 43, K 3.9, creatinine 1.0  PMH: 1. CAD: MI in 1983 when she was in her 53s.  ECG and symptoms suggestive of MI at that time, did not have angiography.  No recurrence.  Has never had left heart cath.  Lexiscan myoview (8/11) showed EF 75%, no evidence of ischemia or infarction.  2. Spinal stenosis: has had epidural steroid shot in past.  3. Left THR in 4/05 4. Hyperlipidemia: statin-induced myalgias, has tolerated low dose Crestor.  5. HTN 6. Impaired fasting glucose  SH: Married (1st husband died then remarried).  Lives in [place].  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   FH: Mother with cerebral hemorrhage. Brother with MI in his 81s.  Multiple family members with MIs on her mother's side.    Current Outpatient Prescriptions  Medication Sig Dispense Refill  . aspirin 81 MG tablet Take 81 mg by mouth daily.        Marland Kitchen atenolol (TENORMIN) 50 MG tablet Take 50 mg by mouth daily.        . calcium carbonate (TUMS)  500 MG chewable tablet Chew 1 tablet by mouth daily.        Marland Kitchen CINNAMON PO Take 2,000 mg by mouth daily.      . Cyanocobalamin (VITAMIN B-12) 2500 MCG SUBL Place under the tongue daily.      Marland Kitchen ezetimibe (ZETIA) 10 MG tablet Take 10 mg by mouth daily.        . fluticasone (FLONASE) 50 MCG/ACT nasal spray as needed.      . hydrochlorothiazide 25 MG tablet Take 25 mg by mouth. Pt takes one tablet daily Mon-Fri      . Omega-3 Fatty Acids (FISH OIL) 1000 MG CAPS Take 3 capsules by mouth 2 (two) times daily.        . rosuvastatin (CRESTOR) 10 MG tablet Take 5 mg by mouth 3 (three) times a week.       . valsartan (DIOVAN) 160 MG tablet Take 160 mg by mouth daily.        Marland Kitchen VITAMIN D, CHOLECALCIFEROL, PO Take by mouth daily.         No current facility-administered medications for this visit.    BP 150/80  Pulse 60  Ht 5\' 1"  (1.549 m)  Wt 59.421 kg (131 lb)  BMI 24.76 kg/m2 General: NAD Neck: No JVD,  no thyromegaly or thyroid nodule.  Lungs: Clear to auscultation bilaterally with normal respiratory effort. CV: Nondisplaced PMI.  Heart regular S1/S2, no S3/S4, no murmur.  No peripheral edema.  No carotid bruit.  Normal pedal pulses.  Bilateral lower leg varicosities.  Abdomen: Soft, nontender, no hepatosplenomegaly, no distention.  Neurologic: Alert and oriented x 3.  Psych: Normal affect. Extremities: No clubbing or cyanosis.   Assessment/Plan:  CORONARY ARTERY DISEASE MI by history in 1983 with no recurrence. Interestingly, she reports significant stress at the time of the event, and I wonder if it was not actually a Takotsubo-type event.  Normal myoview in 8/11. No ischemic symptoms. She is doing well. I will have her continue ASA 81, beta blocker, ARB, and statin.  HYPERTENSION  BP a little but under a lot of stress.  No changes.    HYPERLIPIDEMIA  Check lipids today.   Marca Ancona 01/28/2013

## 2013-02-13 ENCOUNTER — Other Ambulatory Visit: Payer: Self-pay

## 2013-05-01 ENCOUNTER — Other Ambulatory Visit: Payer: Self-pay

## 2013-05-01 DIAGNOSIS — Z1231 Encounter for screening mammogram for malignant neoplasm of breast: Secondary | ICD-10-CM

## 2013-05-22 ENCOUNTER — Ambulatory Visit
Admission: RE | Admit: 2013-05-22 | Discharge: 2013-05-22 | Disposition: A | Discharge status: Home / Self Care | Payer: Medicare Other | Source: Ambulatory Visit

## 2013-05-22 DIAGNOSIS — Z1231 Encounter for screening mammogram for malignant neoplasm of breast: Secondary | ICD-10-CM

## 2014-01-16 ENCOUNTER — Other Ambulatory Visit: Payer: Self-pay | Admitting: Dermatology

## 2014-04-20 ENCOUNTER — Other Ambulatory Visit: Payer: Self-pay

## 2014-04-20 DIAGNOSIS — Z1231 Encounter for screening mammogram for malignant neoplasm of breast: Secondary | ICD-10-CM

## 2014-05-26 ENCOUNTER — Ambulatory Visit
Admission: RE | Admit: 2014-05-26 | Discharge: 2014-05-26 | Disposition: A | Discharge status: Home / Self Care | Payer: Medicare Other | Source: Ambulatory Visit

## 2014-05-26 DIAGNOSIS — Z1231 Encounter for screening mammogram for malignant neoplasm of breast: Secondary | ICD-10-CM

## 2014-10-05 ENCOUNTER — Other Ambulatory Visit: Payer: Self-pay

## 2015-08-12 ENCOUNTER — Other Ambulatory Visit: Payer: Self-pay | Admitting: Internal Medicine

## 2015-08-12 DIAGNOSIS — N63 Unspecified lump in unspecified breast: Secondary | ICD-10-CM

## 2015-08-18 ENCOUNTER — Ambulatory Visit
Admission: RE | Admit: 2015-08-18 | Discharge: 2015-08-18 | Disposition: A | Discharge status: Home / Self Care | Payer: Medicare Other | Source: Ambulatory Visit | Attending: Internal Medicine | Admitting: Internal Medicine

## 2015-08-18 DIAGNOSIS — N63 Unspecified lump in unspecified breast: Secondary | ICD-10-CM

## 2016-06-26 ENCOUNTER — Other Ambulatory Visit: Payer: Self-pay | Admitting: Internal Medicine

## 2016-06-26 DIAGNOSIS — M545 Low back pain: Secondary | ICD-10-CM

## 2016-07-05 ENCOUNTER — Other Ambulatory Visit: Payer: Medicare Other

## 2016-07-16 ENCOUNTER — Ambulatory Visit
Admission: RE | Admit: 2016-07-16 | Discharge: 2016-07-16 | Disposition: A | Discharge status: Home / Self Care | Payer: Medicare Other | Source: Ambulatory Visit | Attending: Internal Medicine | Admitting: Internal Medicine

## 2016-07-16 DIAGNOSIS — M545 Low back pain: Secondary | ICD-10-CM

## 2016-07-18 ENCOUNTER — Other Ambulatory Visit: Payer: Self-pay | Admitting: Internal Medicine

## 2016-07-18 DIAGNOSIS — M545 Low back pain: Secondary | ICD-10-CM

## 2016-07-24 ENCOUNTER — Other Ambulatory Visit: Payer: Medicare Other

## 2016-08-02 ENCOUNTER — Ambulatory Visit
Admission: RE | Admit: 2016-08-02 | Discharge: 2016-08-02 | Disposition: A | Discharge status: Home / Self Care | Payer: Medicare Other | Source: Ambulatory Visit | Attending: Internal Medicine | Admitting: Internal Medicine

## 2016-08-02 DIAGNOSIS — M545 Low back pain: Secondary | ICD-10-CM

## 2016-08-02 MED ORDER — METHYLPREDNISOLONE ACETATE 40 MG/ML INJ SUSP (RADIOLOG
120.0000 mg | Freq: Once | INTRAMUSCULAR | Status: AC
  Administered 2016-08-02: 120 mg via EPIDURAL

## 2016-08-02 MED ORDER — IOPAMIDOL (ISOVUE-M 200) INJECTION 41%
1.0000 mL | Freq: Once | INTRAMUSCULAR | Status: AC
  Administered 2016-08-02: 1 mL via EPIDURAL

## 2016-08-02 NOTE — Discharge Instructions (Signed)

## 2016-08-22 ENCOUNTER — Encounter (HOSPITAL_COMMUNITY): Payer: Self-pay

## 2016-08-22 ENCOUNTER — Observation Stay (HOSPITAL_COMMUNITY)
Admission: EM | Admit: 2016-08-22 | Discharge: 2016-08-23 | Disposition: A | Discharge status: Home / Self Care | Payer: Medicare Other | Attending: Internal Medicine | Admitting: Internal Medicine

## 2016-08-22 DIAGNOSIS — Z96652 Presence of left artificial knee joint: Secondary | ICD-10-CM | POA: Insufficient documentation

## 2016-08-22 DIAGNOSIS — R001 Bradycardia, unspecified: Secondary | ICD-10-CM | POA: Insufficient documentation

## 2016-08-22 DIAGNOSIS — K648 Other hemorrhoids: Secondary | ICD-10-CM | POA: Insufficient documentation

## 2016-08-22 DIAGNOSIS — K922 Gastrointestinal hemorrhage, unspecified: Secondary | ICD-10-CM

## 2016-08-22 DIAGNOSIS — G309 Alzheimer's disease, unspecified: Secondary | ICD-10-CM | POA: Insufficient documentation

## 2016-08-22 DIAGNOSIS — Z7951 Long term (current) use of inhaled steroids: Secondary | ICD-10-CM | POA: Diagnosis not present

## 2016-08-22 DIAGNOSIS — Z7952 Long term (current) use of systemic steroids: Secondary | ICD-10-CM | POA: Diagnosis not present

## 2016-08-22 DIAGNOSIS — K5730 Diverticulosis of large intestine without perforation or abscess without bleeding: Secondary | ICD-10-CM | POA: Diagnosis not present

## 2016-08-22 DIAGNOSIS — K219 Gastro-esophageal reflux disease without esophagitis: Secondary | ICD-10-CM | POA: Insufficient documentation

## 2016-08-22 DIAGNOSIS — R42 Dizziness and giddiness: Secondary | ICD-10-CM | POA: Insufficient documentation

## 2016-08-22 DIAGNOSIS — Z882 Allergy status to sulfonamides status: Secondary | ICD-10-CM | POA: Insufficient documentation

## 2016-08-22 DIAGNOSIS — I2510 Atherosclerotic heart disease of native coronary artery without angina pectoris: Secondary | ICD-10-CM | POA: Insufficient documentation

## 2016-08-22 DIAGNOSIS — E871 Hypo-osmolality and hyponatremia: Secondary | ICD-10-CM | POA: Diagnosis not present

## 2016-08-22 DIAGNOSIS — K644 Residual hemorrhoidal skin tags: Secondary | ICD-10-CM | POA: Insufficient documentation

## 2016-08-22 DIAGNOSIS — M47817 Spondylosis without myelopathy or radiculopathy, lumbosacral region: Secondary | ICD-10-CM | POA: Insufficient documentation

## 2016-08-22 DIAGNOSIS — N179 Acute kidney failure, unspecified: Secondary | ICD-10-CM | POA: Diagnosis not present

## 2016-08-22 DIAGNOSIS — E119 Type 2 diabetes mellitus without complications: Secondary | ICD-10-CM | POA: Insufficient documentation

## 2016-08-22 DIAGNOSIS — F0280 Dementia in other diseases classified elsewhere without behavioral disturbance: Secondary | ICD-10-CM | POA: Insufficient documentation

## 2016-08-22 DIAGNOSIS — N3281 Overactive bladder: Secondary | ICD-10-CM | POA: Insufficient documentation

## 2016-08-22 DIAGNOSIS — Z7982 Long term (current) use of aspirin: Secondary | ICD-10-CM | POA: Diagnosis not present

## 2016-08-22 DIAGNOSIS — Z9049 Acquired absence of other specified parts of digestive tract: Secondary | ICD-10-CM | POA: Insufficient documentation

## 2016-08-22 DIAGNOSIS — E785 Hyperlipidemia, unspecified: Secondary | ICD-10-CM | POA: Diagnosis not present

## 2016-08-22 DIAGNOSIS — K5731 Diverticulosis of large intestine without perforation or abscess with bleeding: Secondary | ICD-10-CM | POA: Diagnosis not present

## 2016-08-22 DIAGNOSIS — I252 Old myocardial infarction: Secondary | ICD-10-CM | POA: Insufficient documentation

## 2016-08-22 DIAGNOSIS — K449 Diaphragmatic hernia without obstruction or gangrene: Secondary | ICD-10-CM | POA: Diagnosis not present

## 2016-08-22 DIAGNOSIS — I10 Essential (primary) hypertension: Secondary | ICD-10-CM | POA: Diagnosis not present

## 2016-08-22 DIAGNOSIS — K921 Melena: Secondary | ICD-10-CM | POA: Insufficient documentation

## 2016-08-22 DIAGNOSIS — D62 Acute posthemorrhagic anemia: Secondary | ICD-10-CM | POA: Diagnosis not present

## 2016-08-22 DIAGNOSIS — Z833 Family history of diabetes mellitus: Secondary | ICD-10-CM | POA: Insufficient documentation

## 2016-08-22 DIAGNOSIS — Z9889 Other specified postprocedural states: Secondary | ICD-10-CM | POA: Insufficient documentation

## 2016-08-22 DIAGNOSIS — Z79899 Other long term (current) drug therapy: Secondary | ICD-10-CM | POA: Insufficient documentation

## 2016-08-22 DIAGNOSIS — Z8249 Family history of ischemic heart disease and other diseases of the circulatory system: Secondary | ICD-10-CM | POA: Insufficient documentation

## 2016-08-22 DIAGNOSIS — Z880 Allergy status to penicillin: Secondary | ICD-10-CM | POA: Insufficient documentation

## 2016-08-22 HISTORY — DX: Dementia in other diseases classified elsewhere, unspecified severity, without behavioral disturbance, psychotic disturbance, mood disturbance, and anxiety: F02.80

## 2016-08-22 HISTORY — DX: Alzheimer's disease, unspecified: G30.9

## 2016-08-22 LAB — PREPARE RBC (CROSSMATCH)

## 2016-08-22 LAB — URINALYSIS, ROUTINE W REFLEX MICROSCOPIC
Bacteria, UA: NONE SEEN
Bilirubin Urine: NEGATIVE
GLUCOSE, UA: NEGATIVE mg/dL
KETONES UR: NEGATIVE mg/dL
LEUKOCYTES UA: NEGATIVE
NITRITE: NEGATIVE
PH: 5 (ref 5.0–8.0)
Protein, ur: NEGATIVE mg/dL
SPECIFIC GRAVITY, URINE: 1.005 (ref 1.005–1.030)

## 2016-08-22 LAB — COMPREHENSIVE METABOLIC PANEL
ALBUMIN: 3.4 g/dL — AB (ref 3.5–5.0)
ALT: 20 U/L (ref 14–54)
AST: 26 U/L (ref 15–41)
Alkaline Phosphatase: 42 U/L (ref 38–126)
Anion gap: 8 (ref 5–15)
BILIRUBIN TOTAL: 0.7 mg/dL (ref 0.3–1.2)
BUN: 17 mg/dL (ref 6–20)
CALCIUM: 8.3 mg/dL — AB (ref 8.9–10.3)
CO2: 23 mmol/L (ref 22–32)
CREATININE: 1.17 mg/dL — AB (ref 0.44–1.00)
Chloride: 102 mmol/L (ref 101–111)
GFR calc Af Amer: 49 mL/min — ABNORMAL LOW (ref >60)
GFR, EST NON AFRICAN AMERICAN: 42 mL/min — AB (ref >60)
GLUCOSE: 153 mg/dL — AB (ref 65–99)
Potassium: 3.8 mmol/L (ref 3.5–5.1)
Sodium: 133 mmol/L — ABNORMAL LOW (ref 135–145)
TOTAL PROTEIN: 5.7 g/dL — AB (ref 6.5–8.1)

## 2016-08-22 LAB — CBC
HEMATOCRIT: 24 % — AB (ref 36.0–46.0)
Hemoglobin: 8.3 g/dL — ABNORMAL LOW (ref 12.0–15.0)
MCH: 32.9 pg (ref 26.0–34.0)
MCHC: 34.6 g/dL (ref 30.0–36.0)
MCV: 95.2 fL (ref 78.0–100.0)
PLATELETS: 159 10*3/uL (ref 150–400)
RBC: 2.52 MIL/uL — ABNORMAL LOW (ref 3.87–5.11)
RDW: 17.3 % — ABNORMAL HIGH (ref 11.5–15.5)
WBC: 7.1 10*3/uL (ref 4.0–10.5)

## 2016-08-22 LAB — GLUCOSE, CAPILLARY: GLUCOSE-CAPILLARY: 124 mg/dL — AB (ref 65–99)

## 2016-08-22 LAB — ABO/RH: ABO/RH(D): A POS

## 2016-08-22 MED ORDER — ATENOLOL 50 MG PO TABS
50.0000 mg | ORAL_TABLET | Freq: Every day | ORAL | Status: DC
  Administered 2016-08-23: 50 mg via ORAL
  Filled 2016-08-22: qty 1

## 2016-08-22 MED ORDER — PEG-KCL-NACL-NASULF-NA ASC-C 100 G PO SOLR
0.5000 | Freq: Once | ORAL | Status: AC
Start: 2016-08-22 — End: 2016-08-22
  Administered 2016-08-22: 100 g via ORAL
  Filled 2016-08-22: qty 1

## 2016-08-22 MED ORDER — PEG-KCL-NACL-NASULF-NA ASC-C 100 G PO SOLR
0.5000 | Freq: Once | ORAL | Status: AC
  Administered 2016-08-23: 100 g via ORAL

## 2016-08-22 MED ORDER — EZETIMIBE 10 MG PO TABS
10.0000 mg | ORAL_TABLET | Freq: Every day | ORAL | Status: DC
  Administered 2016-08-23: 10 mg via ORAL
  Filled 2016-08-22: qty 1

## 2016-08-22 MED ORDER — ONDANSETRON HCL 4 MG PO TABS: 4.0000 mg | ORAL_TABLET | Freq: Four times a day (QID) | ORAL | Status: DC | PRN

## 2016-08-22 MED ORDER — ONDANSETRON HCL 4 MG/2ML IJ SOLN: 4.0000 mg | Freq: Four times a day (QID) | INTRAMUSCULAR | Status: DC | PRN

## 2016-08-22 MED ORDER — SODIUM CHLORIDE 0.9 % IV SOLN: Freq: Once | INTRAVENOUS | Status: DC

## 2016-08-22 MED ORDER — CALCIUM CARBONATE ANTACID 500 MG PO CHEW
1.0000 | CHEWABLE_TABLET | Freq: Every day | ORAL | Status: DC
  Administered 2016-08-23: 200 mg via ORAL
  Filled 2016-08-22: qty 1

## 2016-08-22 MED ORDER — ROSUVASTATIN CALCIUM 5 MG PO TABS
5.0000 mg | ORAL_TABLET | ORAL | Status: DC
  Administered 2016-08-23: 5 mg via ORAL
  Filled 2016-08-22: qty 1

## 2016-08-22 MED ORDER — HYDROCHLOROTHIAZIDE 25 MG PO TABS
25.0000 mg | ORAL_TABLET | Freq: Every day | ORAL | Status: DC
  Administered 2016-08-23: 25 mg via ORAL
  Filled 2016-08-22: qty 1

## 2016-08-22 MED ORDER — PEG-KCL-NACL-NASULF-NA ASC-C 100 G PO SOLR: 1.0000 | Freq: Once | ORAL | Status: DC

## 2016-08-22 MED ORDER — SODIUM CHLORIDE 0.9% FLUSH
3.0000 mL | Freq: Two times a day (BID) | INTRAVENOUS | Status: DC
  Administered 2016-08-22: 3 mL via INTRAVENOUS

## 2016-08-22 MED ORDER — FLUTICASONE PROPIONATE 50 MCG/ACT NA SUSP
1.0000 | Freq: Every day | NASAL | Status: DC
  Administered 2016-08-22: 1 via NASAL
  Filled 2016-08-22: qty 16

## 2016-08-22 NOTE — H&P (Addendum)
History and Physical    Janet Ward IDP:[id number] DOB: [birth year] DOA: 08/22/2016  Referring MD/NP/PA: Dr. Jeneen Rinks   PCP: Crist Infante, MD   Patient coming from: home  Chief Complaint: blood in stool, dizziness   HPI: Janet Ward is a 83 y.o. female with known hypertension and hyperlipidemia, mild dementia at baseline, presented to [facility] emergency department for further evaluation of 2 days duration off intermittent episodes of bright red blood rectum. Patient reports she has seen her primary care physician one day prior to this admission as she has not felt well, her hemoglobin was 9 and her primary care doctor told her to stop taking aspirin as her hemoglobin has dropped from apparent baseline of around 12-13. Patient reports noticing some blood clots as well, this morning had mixed episodes of darker and bright red blood. This was associated with unsteady gait and dizziness, worse with movement, fatigue and malaise. Patient denies chest pain or shortness of breath, no abdominal pain, has had some urinary urgency but no dysuria or hematuria.  Off note, last colonoscopy in 2014 showed small cecal AVMs as well as diverticuli, no malignancy or polyps, upper endoscopy in 2011 showed reflux esophagitis.   ED Course: Patient was hemodynamically stable in emergency department, vital signs notable for mild bradycardia heart rate 57, otherwise stable. Blood work notable for hemoglobin 8.3. Based on review of available office blood work report, hemoglobin on 08/21/2016 was 9. Sodium 133, creatinine 1.17. ER doctor consulted by our GI for assistance and TRH asked to admit for further evaluation. Telemetry bed requested.  Review of Systems:  Constitutional: Negative for fever, chills, diaphoresis HENT: Negative for ear pain, nosebleeds, congestion, facial swelling, rhinorrhea, neck pain, neck stiffness and ear discharge.   Eyes: Negative for pain, discharge, redness, itching and visual  disturbance.  Respiratory: Negative for cough, choking, chest tightness, shortness of breath, wheezing and stridor.   Cardiovascular: Negative for chest pain, palpitations and leg swelling.  Gastrointestinal: Negative for abdominal distention.  Genitourinary: Negative for dysuria, hematuria, flank pain, decreased urine volume Musculoskeletal: Negative for back pain, joint swelling, arthralgias  Neurological: Negative for syncope, facial asymmetry, speech difficulty, weakness, light-headedness, numbness and headaches.  Hematological: Negative for adenopathy. Does not bruise/bleed easily.  Psychiatric/Behavioral: Negative for hallucinations, behavioral problems, confusion, dysphoric mood, decreased concentration and agitation.   Past Medical History:  Diagnosis Date  . Alzheimer's dementia   . Coronary artery disease   . DDD (degenerative disc disease)   . Diabetes mellitus, type 2 ([place])   . Diverticulosis of colon   . Esophageal stricture   . GERD (gastroesophageal reflux disease)   . Heart attack ([place]) 1980   no trouble since  . Hemorrhoids   . Hyperlipidemia   . Hypertension   . OAB (overactive bladder)     Past Surgical History:  Procedure Laterality Date  . APPENDECTOMY    . BACK SURGERY    . BREAST BIOPSY    . CHOLECYSTECTOMY    . left knee replacement    . TONSILLECTOMY AND ADENOIDECTOMY     Social Hx:  reports that she has never smoked. She has never used smokeless tobacco. She reports that she does not drink alcohol or use drugs.  Allergies  Allergen Reactions  . Penicillins Other (See Comments)    Reaction:  Unknown  Has patient had a PCN reaction causing immediate rash, facial/tongue/throat swelling, SOB or lightheadedness with hypotension: Unsure Has patient had a PCN reaction causing severe rash involving mucus  membranes or skin necrosis: Unsure Has patient had a PCN reaction that required hospitalization Unsure Has patient had a PCN reaction occurring within  the last 10 years: No If all of the above answers are "NO", then may proceed with Cephalosporin use.  . Sulfonamide Derivatives Other (See Comments)    Reaction:  Unknown     Family History  Problem Relation Age of Onset  . Heart disease Mother   . Cancer Father   . Diabetes Brother   . Hypertension Brother     Prior to Admission medications   Medication Sig Start Date End Date Taking? Authorizing Provider  aspirin EC 81 MG tablet Take 81 mg by mouth daily.   Yes [provider]  atenolol (TENORMIN) 50 MG tablet Take 50 mg by mouth daily.     Yes [provider]  calcium carbonate (TUMS) 500 MG chewable tablet Chew 1 tablet by mouth daily.      [provider]  ezetimibe (ZETIA) 10 MG tablet Take 10 mg by mouth daily.      [provider]  fluticasone (FLONASE) 50 MCG/ACT nasal spray as needed. 12/18/11   [provider]  hydrochlorothiazide 25 MG tablet Take 25 mg by mouth. Pt takes one tablet daily Mon-Fri    [provider]  rosuvastatin (CRESTOR) 10 MG tablet Take 5 mg by mouth 3 (three) times a week.     [provider]  valsartan (DIOVAN) 160 MG tablet Take 160 mg by mouth daily.      [provider]    Physical Exam: There were no vitals filed for this visit.  Constitutional: NAD, calm, comfortable, Pale There were no vitals filed for this visit. Eyes: PERRL, lids and conjunctivae normal ENMT: Mucous membranes are moist. Posterior pharynx clear of any exudate or lesions.Normal dentition.  Neck: normal, supple, no masses, no thyromegaly Respiratory: clear to auscultation bilaterally, no wheezing, no crackles. Normal respiratory effort. No accessory muscle use.  Cardiovascular: Regular rate and rhythm, no rubs / gallops. No extremity edema. 2+ pedal pulses. No carotid bruits.  Abdomen: no tenderness, no masses palpated. No hepatosplenomegaly. Bowel sounds positive.  Musculoskeletal: no clubbing / cyanosis.  No joint deformity upper and lower extremities.  Skin: no rashes, lesions, ulcers. No induration Neurologic: CN 2-12 grossly intact. Sensation intact, DTR normal. Strength 5/5 in all 4.  Psychiatric: Normal judgment and insight. Alert and oriented x 3. Normal mood.   Labs on Admission: I have personally reviewed following labs and imaging studies  CBC:  Recent Labs Lab 08/22/16 1020  WBC 7.1  HGB 8.3*  HCT 24.0*  MCV 95.2  PLT 631   Basic Metabolic Panel:  Recent Labs Lab 08/22/16 1020  NA 133*  K 3.8  CL 102  CO2 23  GLUCOSE 153*  BUN 17  CREATININE 1.17*  CALCIUM 8.3*   Liver Function Tests:  Recent Labs Lab 08/22/16 1020  AST 26  ALT 20  ALKPHOS 42  BILITOT 0.7  PROT 5.7*  ALBUMIN 3.4*   Radiological Exams on Admission: No results found.  EKG: pending   Assessment/Plan Active Problems:   GI bleed - unclear etiology and possibly lower GI bleed, FOBT + - GI consulted for assistance - GI team consulted for assistance - hold aspirin    Bradycardia - mild, ok to continue atenolol for now but monitor on tele    Dizziness - likely from acute GI bleed - monitor on tele     Hyponatremia, acute  kidney injury - likely pre pre renal in etiology - will request one U PRBC to be transfused  - will repeat BMP in AM - hold valsartan that pt takes at home and resume hopefully in AM  DVT prophylaxis: SCD's Code Status: Full  Family Communication: Pt and daughter updated at bedside Disposition Plan: home once medically stable  Consults called: [facility] Admission status: Observation   Faye Ramsay MD [facility] Pager [phone]  If 7PM-7AM, please contact night-coverage [email or web address] Password [facility]  08/22/2016, 11:33 AM

## 2016-08-22 NOTE — Consult Note (Signed)
Referring Provider: Dr. Jeneen Rinks, EDP Primary Care Physician:  Crist Infante, MD Primary Gastroenterologist:  Dr. Henrene Pastor  Reason for Consultation:  GI bleed  HPI: Janet Ward is a 83 y.o. female with known hypertension and hyperlipidemia, mild dementia at baseline who presented to [facility] emergency department with her daughter for further evaluation of 2 days duration off intermittent episodes of bright red blood rectum. She says that Sunday morning she passed some blood when she had a BM.  Did not have any other bleeding throughout the day.  Then Monday morning the same thing occurred.  She went to see her primary care physician yesterday and her hemoglobin was 9 grams.  Her primary care doctor told her to stop taking aspirin 81 mg as her hemoglobin has dropped from apparent baseline of around 12-13 grams. Then this morning she had the same thing occur on two occasions in larger amounts this time so the patient's daughter brought her to the ED.  Patient reports noticing some blood clots as well, this morning had mixed episodes of darker and bright red blood. This was associated with unsteady gait and dizziness and fatigue. Patient denies chest pain or shortness of breath, no abdominal pain.  Of note, last colonoscopy in 12/2012 by Dr. Henrene Pastor showed small cecal AVMs as well as diverticulosis only.  ED Course: Patient was hemodynamically stable in emergency department, vital signs notable for mild bradycardia heart rate 57, otherwise stable. Blood work notable for hemoglobin 8.3 grams. Based on review of available office blood work report, hemoglobin on 08/21/2016 was 9 grams. Sodium 133, creatinine 1.17.  BUN normal. TRH asked to admit for further evaluation. GI consulted.  PRBC's have been ordered but not yet given.   Past Medical History:  Diagnosis Date  . Alzheimer's dementia   . Coronary artery disease   . DDD (degenerative disc disease)   . Diabetes mellitus, type 2 ([place])   .  Diverticulosis of colon   . Esophageal stricture   . GERD (gastroesophageal reflux disease)   . Heart attack ([place]) 1980   no trouble since  . Hemorrhoids   . Hyperlipidemia   . Hypertension   . OAB (overactive bladder)     Past Surgical History:  Procedure Laterality Date  . APPENDECTOMY    . BACK SURGERY    . BREAST BIOPSY    . CHOLECYSTECTOMY    . left knee replacement    . TONSILLECTOMY AND ADENOIDECTOMY      Prior to Admission medications   Medication Sig Start Date End Date Taking? Authorizing Provider  aspirin EC 81 MG tablet Take 81 mg by mouth daily.   Yes [provider]  atenolol (TENORMIN) 50 MG tablet Take 50 mg by mouth daily.     Yes [provider]  ezetimibe (ZETIA) 10 MG tablet Take 10 mg by mouth at bedtime.    Yes [provider]  fluticasone (FLONASE) 50 MCG/ACT nasal spray Place 1-2 sprays into both nostrils daily as needed for rhinitis.    Yes [provider]  prednisoLONE acetate (PRED FORTE) 1 % ophthalmic suspension Place 1 drop into both eyes 4 (four) times daily.   Yes [provider]  rivastigmine (EXELON) 9.5 mg/24hr Place 9.5 mg onto the skin daily.   Yes [provider]  rosuvastatin (CRESTOR) 10 MG tablet Take 10 mg by mouth at bedtime.    Yes [provider]  valsartan (DIOVAN) 160 MG tablet Take 160 mg by mouth daily.  Yes [provider]    Current Facility-Administered Medications  Medication Dose Route Frequency Provider Last Rate Last Dose  . 0.9 %  sodium chloride infusion   Intravenous Once Theodis Blaze, MD      . Derrill Memo ON 08/23/2016] atenolol (TENORMIN) tablet 50 mg  50 mg Oral Daily Theodis Blaze, MD      . calcium carbonate (TUMS - dosed in mg elemental calcium) chewable tablet 200 mg of elemental calcium  1 tablet Oral Daily Theodis Blaze, MD      . ezetimibe (ZETIA) tablet 10 mg  10 mg Oral Daily Theodis Blaze, MD      . fluticasone [facility]) 50 MCG/ACT  nasal spray 1 spray  1 spray Each Nare Daily Theodis Blaze, MD      . hydrochlorothiazide (HYDRODIURIL) tablet 25 mg  25 mg Oral Daily Theodis Blaze, MD      . ondansetron [facility]) tablet 4 mg  4 mg Oral Q6H PRN Theodis Blaze, MD       Or  . ondansetron [facility]) injection 4 mg  4 mg Intravenous Q6H PRN Theodis Blaze, MD      . Derrill Memo ON 08/23/2016] rosuvastatin (CRESTOR) tablet 5 mg  5 mg Oral Once per day on Mon Wed Fri Myers, Iskra M, MD      . sodium chloride flush (NS) 0.9 % injection 3 mL  3 mL Intravenous Q12H Theodis Blaze, MD        Allergies as of 08/22/2016 - Review Complete 08/22/2016  Allergen Reaction Noted  . Penicillins Other (See Comments)   . Sulfonamide derivatives Other (See Comments)     Family History  Problem Relation Age of Onset  . Heart disease Mother   . Cancer Father   . Diabetes Brother   . Hypertension Brother     Social History   Social History  . Marital status: Married    Spouse name: N/A  . Number of children: 2  . Years of education: N/A   Occupational History  . Retired Pharmacist, hospital    Social History Main Topics  . Smoking status: Never Smoker  . Smokeless tobacco: Never Used  . Alcohol use No     Comment: occasionally drinks wine  . Drug use: No  . Sexual activity: Not on file   Other Topics Concern  . Not on file   Social History Narrative  . No narrative on file    Review of Systems: ROS is O/W negative except as mentioned in HPI.  Physical Exam: Vital signs in last 24 hours: Temp:  [98.7 F (37.1 C)] 98.7 F (37.1 C) (05/15 1200) Pulse Rate:  [57-59] 59 (05/15 1200) Resp:  [17-18] 18 (05/15 1200) BP: (116-123)/(69-90) 123/69 (05/15 1200) SpO2:  [100 %] 100 % (05/15 1200)   General:  Alert, Well-developed, well-nourished, pleasant and cooperative in NAD Head:  Normocephalic and atraumatic. Eyes:  Sclera clear, no icterus.  Conjunctiva pink. Ears:  Normal auditory acuity. Mouth:  No deformity or lesions.   Lungs:   Clear throughout to auscultation.  No wheezes, crackles, or rhonchi.  No increased WOB. Heart:  Regular rate and rhythm; no murmurs, clicks, rubs, or gallops. Abdomen:  Soft, non-distended.  BS present.  Non-tender. Rectal:  Will be done at the time of colonoscopy.  Msk:  Symmetrical without gross deformities. Pulses:  Normal pulses noted. Extremities:  Without clubbing or edema. Neurologic:  Alert and oriented x 4;  grossly  normal neurologically. Skin:  Intact without significant lesions or rashes. Psych:  Alert and cooperative. Normal mood and affect.  Lab Results:  Recent Labs  08/22/16 1020  WBC 7.1  HGB 8.3*  HCT 24.0*  PLT 159   BMET  Recent Labs  08/22/16 1020  NA 133*  K 3.8  CL 102  CO2 23  GLUCOSE 153*  BUN 17  CREATININE 1.17*  CALCIUM 8.3*   LFT  Recent Labs  08/22/16 1020  PROT 5.7*  ALBUMIN 3.4*  AST 26  ALT 20  ALKPHOS 42  BILITOT 0.7   IMPRESSION:  -Lower GI bleed:  Suspect diverticular bleed vs AVM's, which were both seen on 12/2012 colonoscopy. -ABLA:  Hgb 8.3 grams.  Reported normal around 12 grams.  PRBC's ordered since patient is symptomatic but they have not been given yet.  PLAN: -Agree with transfusion.  Monitor Hgb. -Discussed observation vs colonoscopy to see if there are AVM's that can be treated.  Patient and daughter would like to proceed with colonoscopy.  Will schedule with Dr. Ardis Hughs for 5/16.   ZEHR, JESSICA D.  08/22/2016, 12:36 PM  Pager number [phone]  ________________________________________________________________________  Velora Heckler GI MD note:  I personally examined the patient, reviewed the data and agree with the assessment and plan described above.  Painless hematochezia, known diverticulosis and right sided AVMs.  Hemodynamically stable. Planning on colonoscopy tomorrow. If bleeding diverticulum is noted will treat, if not then will ablate right sided AVMs.   Owens Loffler, MD [facility] Pager  [phone]

## 2016-08-22 NOTE — ED Triage Notes (Addendum)
Pt c/o increasing weakness and rectal bleeding x 2 days.  Denies pain.  Pt sts dark red bleeding.  Pt was seen by PCP yesterday and prescribed iron and omeprazole.  Pt is only seeing bleeding w/ BMs.  Hx of Alzheimer's.       Pt had Hgb 9.0 yesterday.

## 2016-08-22 NOTE — ED Provider Notes (Signed)
[facility] DEPT Provider Note   CSN: [id number] Arrival date & time: 08/22/16  2505     History   Chief Complaint Chief Complaint  Patient presents with  . Rectal Bleeding  . Weakness    HPI Janet Ward is a 83 y.o. female. Chief complaint is rectal bleeding.  HPI: This is an 83 year old female who describes blood with, and after bowel movements for the last 2 days.  Was seen by Dr. Haynes Kerns her primary care physician at [place] yesterday. Had hemoglobin of 9. Was told to stop aspirin. Was given omeprazole prescription. She had otherwise been feeling well. She has had several additional ruddy bowel movement since that time. Daughter with her states they were bright red yesterday. Today were slightly less bright and some associated clots. Patient feels weak and somewhat orthostatic although not presyncopal.  She has no abdominal pain. She's not had nausea or vomiting.  She was on a daily 81 mg aspirin. She took Advil infrequently. No alcohol or smoking. Prior upper and lower endoscopies. Colonoscopy by  Dr. Henrene Pastor 2014 showed small cecal AVMs as well as diverticuli. No malignancies, or polyps. Upper endoscopy last 2011 also Dr. Henrene Pastor showed reflux esophagitis.  Past Medical History:  Diagnosis Date  . Alzheimer's dementia   . Coronary artery disease   . DDD (degenerative disc disease)   . Diabetes mellitus, type 2 ([place])   . Diverticulosis of colon   . Esophageal stricture   . GERD (gastroesophageal reflux disease)   . Heart attack ([place]) 1980   no trouble since  . Hemorrhoids   . Hyperlipidemia   . Hypertension   . OAB (overactive bladder)     Patient Active Problem List   Diagnosis Date Noted  . HYPERLIPIDEMIA 08/29/2007  . HYPERTENSION 08/29/2007  . CORONARY ARTERY DISEASE 08/29/2007  . DYSPHAGIA 08/29/2007  . HEMORRHOIDS 08/27/2007  . GERD 08/27/2007  . HIATAL HERNIA 08/27/2007  . DIVERTICULOSIS, COLON 08/27/2007    Past Surgical History:    Procedure Laterality Date  . APPENDECTOMY    . BACK SURGERY    . BREAST BIOPSY    . CHOLECYSTECTOMY    . left knee replacement    . TONSILLECTOMY AND ADENOIDECTOMY      OB History    No data available       Home Medications    Prior to Admission medications   Medication Sig Start Date End Date Taking? Authorizing Provider  aspirin 81 MG tablet Take 81 mg by mouth daily.      [provider]  atenolol (TENORMIN) 50 MG tablet Take 50 mg by mouth daily.      [provider]  calcium carbonate (TUMS) 500 MG chewable tablet Chew 1 tablet by mouth daily.      [provider]  CINNAMON PO Take 2,000 mg by mouth daily.    [provider]  Cyanocobalamin (VITAMIN B-12) 2500 MCG SUBL Place under the tongue daily.    [provider]  ezetimibe (ZETIA) 10 MG tablet Take 10 mg by mouth daily.      [provider]  fluticasone (FLONASE) 50 MCG/ACT nasal spray as needed. 12/18/11   [provider]  hydrochlorothiazide 25 MG tablet Take 25 mg by mouth. Pt takes one tablet daily Mon-Fri    [provider]  Omega-3 Fatty Acids (FISH OIL) 1000 MG CAPS Take 3 capsules by mouth 2 (two) times daily.      [provider]  rosuvastatin (CRESTOR)  10 MG tablet Take 5 mg by mouth 3 (three) times a week.     [provider]  valsartan (DIOVAN) 160 MG tablet Take 160 mg by mouth daily.      [provider]  VITAMIN D, CHOLECALCIFEROL, PO Take by mouth daily.      [provider]    Family History Family History  Problem Relation Age of Onset  . Heart disease Mother   . Cancer Father   . Diabetes Brother   . Hypertension Brother     Social History Social History  Substance Use Topics  . Smoking status: Never Smoker  . Smokeless tobacco: Never Used  . Alcohol use No     Comment: occasionally drinks wine     Allergies   Penicillins and Sulfonamide derivatives   Review of Systems Review  of Systems  Constitutional: Negative for appetite change, chills, diaphoresis, fatigue and fever.  HENT: Negative for mouth sores, sore throat and trouble swallowing.   Eyes: Negative for visual disturbance.  Respiratory: Negative for cough, chest tightness, shortness of breath and wheezing.   Cardiovascular: Negative for chest pain.  Gastrointestinal: Positive for blood in stool. Negative for abdominal distention, abdominal pain, diarrhea, nausea and vomiting.  Endocrine: Negative for polydipsia, polyphagia and polyuria.  Genitourinary: Negative for dysuria, frequency and hematuria.  Musculoskeletal: Negative for gait problem.  Skin: Negative for color change, pallor and rash.  Neurological: Positive for weakness. Negative for dizziness, syncope, light-headedness and headaches.  Hematological: Does not bruise/bleed easily.  Psychiatric/Behavioral: Negative for behavioral problems and confusion.     Physical Exam Updated Vital Signs There were no vitals taken for this visit.  Physical Exam  Constitutional: She is oriented to person, place, and time. She appears well-developed and well-nourished. No distress.  83 year old female. Awake. Some dementia noted. Her daughter augments her history. She appears pale, but in no distress  HENT:  Head: Normocephalic.  Eyes: Conjunctivae are normal. Pupils are equal, round, and reactive to light. No scleral icterus.  Conjunctiva appear pale  Neck: Normal range of motion. Neck supple. No thyromegaly present.  Cardiovascular: Normal rate and regular rhythm.  Exam reveals no gallop and no friction rub.   No murmur heard. Pulmonary/Chest: Effort normal and breath sounds normal. No respiratory distress. She has no wheezes. She has no rales.  Abdominal: Soft. Bowel sounds are normal. She exhibits no distension. There is no tenderness. There is no rebound.  Soft benign abdomen. No tenderness.  Musculoskeletal: Normal range of motion.  Neurological:  She is alert and oriented to person, place, and time.  Skin: Skin is warm and dry. No rash noted.  Psychiatric: She has a normal mood and affect. Her behavior is normal.     ED Treatments / Results  Labs (all labs ordered are listed, but only abnormal results are displayed) Labs Reviewed  CBC - Abnormal; Notable for the following:       Result Value   RBC 2.52 (*)    Hemoglobin 8.3 (*)    HCT 24.0 (*)    RDW 17.3 (*)    All other components within normal limits  COMPREHENSIVE METABOLIC PANEL  URINALYSIS, ROUTINE W REFLEX MICROSCOPIC  POC OCCULT BLOOD, ED  TYPE AND SCREEN    EKG  EKG Interpretation None       Radiology No results found.  Procedures Procedures (including critical care time)  Medications Ordered in ED Medications - No data to display   Initial Impression / Assessment and  Plan / ED Course  I have reviewed the triage vital signs and the nursing notes.  Pertinent labs & imaging results that were available during my care of the patient were reviewed by me and considered in my medical decision making (see chart for details).    Anemia secondary to GI bleed. Likely lower. Have asked for type and screen. Patient made nothing by mouth. We'll discuss with hospitalist, and her GI physician regarding admission. Transfuse as necessary.  Final Clinical Impressions(s) / ED Diagnoses   Final diagnoses:  Gastrointestinal hemorrhage, unspecified gastrointestinal hemorrhage type    New Prescriptions New Prescriptions   No medications on file     Tanna Furry, MD 08/22/16 1046

## 2016-08-22 NOTE — ED Notes (Signed)
Bed: WA09 Expected date:  Expected time:  Means of arrival:  Comments: Triage 2 

## 2016-08-22 NOTE — ED Notes (Signed)
RN starting a line and collecting blood work. 

## 2016-08-23 ENCOUNTER — Observation Stay (HOSPITAL_COMMUNITY): Payer: Medicare Other | Admitting: Certified Registered Nurse Anesthetist

## 2016-08-23 ENCOUNTER — Encounter (HOSPITAL_COMMUNITY): Payer: Self-pay | Admitting: *Deleted

## 2016-08-23 ENCOUNTER — Telehealth: Payer: Self-pay

## 2016-08-23 ENCOUNTER — Encounter (HOSPITAL_COMMUNITY)
Admission: EM | Disposition: A | Discharge status: Home / Self Care | Payer: Self-pay | Source: Home / Self Care | Attending: Emergency Medicine

## 2016-08-23 DIAGNOSIS — K921 Melena: Secondary | ICD-10-CM

## 2016-08-23 DIAGNOSIS — K648 Other hemorrhoids: Secondary | ICD-10-CM | POA: Diagnosis not present

## 2016-08-23 DIAGNOSIS — G309 Alzheimer's disease, unspecified: Secondary | ICD-10-CM

## 2016-08-23 DIAGNOSIS — K5730 Diverticulosis of large intestine without perforation or abscess without bleeding: Secondary | ICD-10-CM

## 2016-08-23 DIAGNOSIS — K5731 Diverticulosis of large intestine without perforation or abscess with bleeding: Secondary | ICD-10-CM

## 2016-08-23 DIAGNOSIS — I10 Essential (primary) hypertension: Secondary | ICD-10-CM

## 2016-08-23 DIAGNOSIS — D62 Acute posthemorrhagic anemia: Secondary | ICD-10-CM

## 2016-08-23 DIAGNOSIS — K922 Gastrointestinal hemorrhage, unspecified: Secondary | ICD-10-CM | POA: Diagnosis not present

## 2016-08-23 DIAGNOSIS — F0280 Dementia in other diseases classified elsewhere without behavioral disturbance: Secondary | ICD-10-CM

## 2016-08-23 HISTORY — PX: COLONOSCOPY WITH PROPOFOL: SHX5780

## 2016-08-23 LAB — CBC
HCT: 24.8 % — ABNORMAL LOW (ref 36.0–46.0)
Hemoglobin: 8.3 g/dL — ABNORMAL LOW (ref 12.0–15.0)
MCH: 31.3 pg (ref 26.0–34.0)
MCHC: 33.5 g/dL (ref 30.0–36.0)
MCV: 93.6 fL (ref 78.0–100.0)
PLATELETS: 116 10*3/uL — AB (ref 150–400)
RBC: 2.65 MIL/uL — ABNORMAL LOW (ref 3.87–5.11)
RDW: 17.1 % — AB (ref 11.5–15.5)
WBC: 5.4 10*3/uL (ref 4.0–10.5)

## 2016-08-23 LAB — BPAM RBC
Blood Product Expiration Date: 201805282359
ISSUE DATE / TIME: 201805151330
Unit Type and Rh: 6200

## 2016-08-23 LAB — TYPE AND SCREEN
ABO/RH(D): A POS
Antibody Screen: NEGATIVE
Unit division: 0

## 2016-08-23 SURGERY — COLONOSCOPY WITH PROPOFOL
Anesthesia: Monitor Anesthesia Care

## 2016-08-23 MED ORDER — ESMOLOL HCL 100 MG/10ML IV SOLN
INTRAVENOUS | Status: DC | PRN
  Administered 2016-08-23: 10 mg via INTRAVENOUS

## 2016-08-23 MED ORDER — SODIUM CHLORIDE 0.9 % IV SOLN: INTRAVENOUS | Status: DC

## 2016-08-23 MED ORDER — PROPOFOL 500 MG/50ML IV EMUL
INTRAVENOUS | Status: DC | PRN
  Administered 2016-08-23: 100 ug/kg/min via INTRAVENOUS

## 2016-08-23 MED ORDER — LACTATED RINGERS IV SOLN
INTRAVENOUS | Status: DC
  Administered 2016-08-23: 10:00:00 via INTRAVENOUS

## 2016-08-23 MED ORDER — PROPOFOL 10 MG/ML IV BOLUS
INTRAVENOUS | Status: AC
  Filled 2016-08-23: qty 40

## 2016-08-23 MED ORDER — PROPOFOL 10 MG/ML IV BOLUS
INTRAVENOUS | Status: DC | PRN
  Administered 2016-08-23: 40 mg via INTRAVENOUS
  Administered 2016-08-23 (×2): 20 mg via INTRAVENOUS
  Administered 2016-08-23: 10 mg via INTRAVENOUS

## 2016-08-23 SURGICAL SUPPLY — 21 items

## 2016-08-23 NOTE — Discharge Summary (Signed)
Discharge Summary  Janet Ward TIR:[id number] DOB: 01-May-1931  PCP: Crist Infante, MD  Admit date: 08/22/2016 Discharge date: 08/23/2016  Time spent: 25 minutes   Recommendations for Outpatient Follow-up:  1. Patient has follow-up physical already scheduled with her PCP in one week. At that time, she needs CBC checked   Discharge Diagnoses:  Active Hospital Problems   Diagnosis Date Noted  . Diverticulosis of colon with hemorrhage 08/22/2016  . Acute blood loss anemia 08/23/2016  . Hyperlipidemia 08/29/2007  . Essential hypertension 08/29/2007    Resolved Hospital Problems   Diagnosis Date Noted Date Resolved  No resolved problems to display.    Discharge Condition: Improved, being discharged home   Diet recommendation: Heart healthy, low residue   Vitals:   08/23/16 1100 08/23/16 1129  BP: (!) 122/58 (!) 118/52  Pulse: 79 67  Resp: 20 18  Temp:  98.3 F (36.8 C)    History of present illness:  83 year old female with past oral history of hypertension and hyperlipidemia as well as previous cecal AVMs presented to the emergency room with several days of bright red blood per rectum. Patient saw her PCP on 5/14 and at that time hemoglobin was checked and found to be 9 (baseline around 12-13) patient had some mild dizziness with this. Gastroenterology consulted and evaluated patient.   Hospital Course:  Principal Problem:   Diverticulosis of colon with hemorrhage with acute blood loss anemia: Patient monitored overnight. Hemoglobin dropped to 8.3. She was taken for colonoscopy on 5/16. No AVMs were noted, but she was noted to have extensive diverticulosis which was felt to be source of bleeding. No active bleeding at this time. Follow-up hemoglobin 8 hours later unchanged. Vital signs stable. Patient felt to be medically stable for discharge. Follow-up CBC when she sees her primary care doctor Active Problems:   Hyperlipidemia: Stable, continue statin   Essential  hypertension: Blood pressure stable, resume   Senile dementia without behavioral disturbance: Stable. Alert and oriented 3. Continue Exelon patch.   Procedures:  Colonoscopy done 5/16: Extensive diverticulosis throughout   Consultations:  [facility]   Discharge Exam: BP (!) 118/52 (BP Location: Left Arm)   Pulse 67   Temp 98.3 F (36.8 C) (Oral)   Resp 18   Ht 5\' 1"  (1.549 m)   Wt 53.2 kg (117 lb 4.6 oz)   SpO2 97%   BMI 22.16 kg/m   General: Alert and oriented 3, no acute distress  Cardiovascular: Regular rate and rhythm, S1-S2  Respiratory: Clear auscultation bilaterally   Discharge Instructions You were cared for by a hospitalist during your hospital stay. If you have any questions about your discharge medications or the care you received while you were in the hospital after you are discharged, you can call the unit and asked to speak with the hospitalist on call if the hospitalist that took care of you is not available. Once you are discharged, your primary care physician will handle any further medical issues. Please note that NO REFILLS for any discharge medications will be authorized once you are discharged, as it is imperative that you return to your primary care physician (or establish a relationship with a primary care physician if you do not have one) for your aftercare needs so that they can reassess your need for medications and monitor your lab values.  Discharge Instructions    Diet - low sodium heart healthy    Complete by:  As directed    Increase activity slowly  Complete by:  As directed      Allergies as of 08/23/2016      Reactions   Penicillins Other (See Comments)   Reaction:  Unknown  Has patient had a PCN reaction causing immediate rash, facial/tongue/throat swelling, SOB or lightheadedness with hypotension: Unsure Has patient had a PCN reaction causing severe rash involving mucus membranes or skin necrosis: Unsure Has patient had a PCN  reaction that required hospitalization Unsure Has patient had a PCN reaction occurring within the last 10 years: No If all of the above answers are "NO", then may proceed with Cephalosporin use.   Sulfonamide Derivatives Other (See Comments)   Reaction:  Unknown       Medication List    TAKE these medications   aspirin EC 81 MG tablet Take 81 mg by mouth daily.   atenolol 50 MG tablet Commonly known as:  TENORMIN Take 50 mg by mouth daily.   ezetimibe 10 MG tablet Commonly known as:  ZETIA Take 10 mg by mouth at bedtime.   fluticasone 50 MCG/ACT nasal spray Commonly known as:  FLONASE Place 1-2 sprays into both nostrils daily as needed for rhinitis.   prednisoLONE acetate 1 % ophthalmic suspension Commonly known as:  PRED FORTE Place 1 drop into both eyes 4 (four) times daily.   rivastigmine 9.5 mg/24hr Commonly known as:  EXELON Place 9.5 mg onto the skin daily.   rosuvastatin 10 MG tablet Commonly known as:  CRESTOR Take 10 mg by mouth at bedtime.   valsartan 160 MG tablet Commonly known as:  DIOVAN Take 160 mg by mouth daily.      Allergies  Allergen Reactions  . Penicillins Other (See Comments)    Reaction:  Unknown  Has patient had a PCN reaction causing immediate rash, facial/tongue/throat swelling, SOB or lightheadedness with hypotension: Unsure Has patient had a PCN reaction causing severe rash involving mucus membranes or skin necrosis: Unsure Has patient had a PCN reaction that required hospitalization Unsure Has patient had a PCN reaction occurring within the last 10 years: No If all of the above answers are "NO", then may proceed with Cephalosporin use.  . Sulfonamide Derivatives Other (See Comments)    Reaction:  Unknown       The results of significant diagnostics from this hospitalization (including imaging, microbiology, ancillary and laboratory) are listed below for reference.    Significant Diagnostic Studies: Dg Inject Diag/thera/inc  Needle/cath/plc Epi/lumb/sac W/img  Result Date: 08/02/2016 CLINICAL DATA:  Lumbosacral spondylosis without myelopathy. Left low back pain, occasionally radiating into the left lower extremity. MRI demonstrates prior posterior decompression at L4-5 with varying degrees of spinal, lateral recess, and neural foraminal stenosis at all of the other lumbar levels. FLUOROSCOPY TIME:  Radiation Exposure Index (as provided by the fluoroscopic device): 10.52 microGray*m^2 Fluoroscopy Time (in minutes and seconds):  8 seconds PROCEDURE: The procedure, risks, benefits, and alternatives were explained to the patient. Questions regarding the procedure were encouraged and answered. The patient understands and consents to the procedure. LUMBAR EPIDURAL INJECTION: An interlaminar approach was performed on the left at L3-4. The overlying skin was cleansed and anesthetized. A 3.5 inch 20 gauge epidural needle was advanced using loss-of-resistance technique. DIAGNOSTIC EPIDURAL INJECTION: Injection of Isovue-M 200 shows a good epidural pattern with spread above and below the level of needle placement, primarily on the left. No vascular opacification is seen. THERAPEUTIC EPIDURAL INJECTION: 120 mg of Depo-Medrol mixed with 3 mL of 1% lidocaine were instilled. The procedure was well-tolerated,  and the patient was discharged thirty minutes following the injection in good condition. COMPLICATIONS: None IMPRESSION: Technically successful lumbar interlaminar epidural injection on the left at L3-4. Electronically Signed   By: Logan Bores M.D.   On: 08/02/2016 14:37    Microbiology: No results found for this or any previous visit (from the past 240 hour(s)).   Labs: Basic Metabolic Panel:  Recent Labs Lab 08/22/16 1020  NA 133*  K 3.8  CL 102  CO2 23  GLUCOSE 153*  BUN 17  CREATININE 1.17*  CALCIUM 8.3*   Liver Function Tests:  Recent Labs Lab 08/22/16 1020  AST 26  ALT 20  ALKPHOS 42  BILITOT 0.7  PROT 5.7*    ALBUMIN 3.4*   No results for input(s): LIPASE, AMYLASE in the last 168 hours. No results for input(s): AMMONIA in the last 168 hours. CBC:  Recent Labs Lab 08/22/16 1020 08/23/16 1117  WBC 7.1 5.4  HGB 8.3* 8.3*  HCT 24.0* 24.8*  MCV 95.2 93.6  PLT 159 116*   Cardiac Enzymes: No results for input(s): CKTOTAL, CKMB, CKMBINDEX, TROPONINI in the last 168 hours. BNP: BNP (last 3 results) No results for input(s): BNP in the last 8760 hours.  ProBNP (last 3 results) No results for input(s): PROBNP in the last 8760 hours.  CBG:  Recent Labs Lab 08/22/16 2010  GLUCAP 124*       Signed:  Annita Brod, MD [facility] 08/23/2016, 1:19 PM

## 2016-08-23 NOTE — Progress Notes (Signed)
Discharge to home via family car, Patient states that she understands discharge instruction.

## 2016-08-23 NOTE — Anesthesia Postprocedure Evaluation (Signed)
Anesthesia Post Note  Patient: Janet Ward  Procedure(s) Performed: Procedure(s) (LRB): COLONOSCOPY WITH PROPOFOL (N/A)  Patient location during evaluation: Endoscopy Anesthesia Type: MAC Level of consciousness: awake and alert Pain management: pain level controlled Vital Signs Assessment: post-procedure vital signs reviewed and stable Respiratory status: spontaneous breathing, nonlabored ventilation, respiratory function stable and patient connected to nasal cannula oxygen Cardiovascular status: stable and blood pressure returned to baseline Anesthetic complications: no       Last Vitals:  Vitals:   08/23/16 1100 08/23/16 1129  BP: (!) 122/58 (!) 118/52  Pulse: 79 67  Resp: 20 18  Temp:  36.8 C    Last Pain:  Vitals:   08/23/16 1129  TempSrc: Oral                 Kaitlinn Iversen,JAMES TERRILL

## 2016-08-23 NOTE — Anesthesia Preprocedure Evaluation (Signed)
Anesthesia Evaluation  Patient identified by MRN, date of birth, ID band Patient awake    Airway Mallampati: II   Neck ROM: Full    Dental no notable dental hx.    Pulmonary    breath sounds clear to auscultation       Cardiovascular hypertension, + CAD   Rhythm:Regular Rate:Normal     Neuro/Psych    GI/Hepatic GERD  ,Gi bleed   Endo/Other  diabetes  Renal/GU      Musculoskeletal   Abdominal   Peds  Hematology   Anesthesia Other Findings   Reproductive/Obstetrics                             Anesthesia Physical Anesthesia Plan  ASA: III  Anesthesia Plan: MAC   Post-op Pain Management:    Induction: Intravenous  Airway Management Planned: Natural Airway and Simple Face Mask  Additional Equipment:   Intra-op Plan:   Post-operative Plan:   Informed Consent: I have reviewed the patients History and Physical, chart, labs and discussed the procedure including the risks, benefits and alternatives for the proposed anesthesia with the patient or authorized representative who has indicated his/her understanding and acceptance.     Plan Discussed with: CRNA  Anesthesia Plan Comments:         Anesthesia Quick Evaluation

## 2016-08-23 NOTE — Discharge Instructions (Signed)
YOU HAD AN ENDOSCOPIC PROCEDURE TODAY: Refer to the procedure report and other information in the discharge instructions given to you for any specific questions about what was found during the examination. If this information does not answer your questions, please call [place] office at [phone] to clarify.   YOU SHOULD EXPECT: Some feelings of bloating in the abdomen. Passage of more gas than usual. Walking can help get rid of the air that was put into your GI tract during the procedure and reduce the bloating. If you had a lower endoscopy (such as a colonoscopy or flexible sigmoidoscopy) you may notice spotting of blood in your stool or on the toilet paper. Some abdominal soreness may be present for a day or two, also.  DIET: Your first meal following the procedure should be a light meal and then it is ok to progress to your normal diet. A half-sandwich or bowl of soup is an example of a good first meal. Heavy or fried foods are harder to digest and may make you feel nauseous or bloated. Drink plenty of fluids but you should avoid alcoholic beverages for 24 hours. If you had a esophageal dilation, please see attached instructions for diet.    ACTIVITY: Your care partner should take you home directly after the procedure. You should plan to take it easy, moving slowly for the rest of the day. You can resume normal activity the day after the procedure however YOU SHOULD NOT DRIVE, use power tools, machinery or perform tasks that involve climbing or major physical exertion for 24 hours (because of the sedation medicines used during the test).   SYMPTOMS TO REPORT IMMEDIATELY: A gastroenterologist can be reached at any hour. Please call [phone]  for any of the following symptoms:  Following lower endoscopy (colonoscopy, flexible sigmoidoscopy) Excessive amounts of blood in the stool  Significant tenderness, worsening of abdominal pains  Swelling of the abdomen that is new, acute  Fever of 100 or  higher

## 2016-08-23 NOTE — Telephone Encounter (Signed)
-----   Message from Milus Banister, MD sent at 08/23/2016 10:55 AM EDT ----- She needs CBC in 2 weeks, thanks

## 2016-08-23 NOTE — H&P (View-Only) (Signed)
Referring Provider: Dr. Jeneen Rinks, EDP Primary Care Physician:  Crist Infante, MD Primary Gastroenterologist:  Dr. Henrene Pastor  Reason for Consultation:  GI bleed  HPI: Janet Ward is a 83 y.o. female with known hypertension and hyperlipidemia, mild dementia at baseline who presented to [facility] emergency department with her daughter for further evaluation of 2 days duration off intermittent episodes of bright red blood rectum. She says that Sunday morning she passed some blood when she had a BM.  Did not have any other bleeding throughout the day.  Then Monday morning the same thing occurred.  She went to see her primary care physician yesterday and her hemoglobin was 9 grams.  Her primary care doctor told her to stop taking aspirin 81 mg as her hemoglobin has dropped from apparent baseline of around 12-13 grams. Then this morning she had the same thing occur on two occasions in larger amounts this time so the patient's daughter brought her to the ED.  Patient reports noticing some blood clots as well, this morning had mixed episodes of darker and bright red blood. This was associated with unsteady gait and dizziness and fatigue. Patient denies chest pain or shortness of breath, no abdominal pain.  Of note, last colonoscopy in 12/2012 by Dr. Henrene Pastor showed small cecal AVMs as well as diverticulosis only.  ED Course: Patient was hemodynamically stable in emergency department, vital signs notable for mild bradycardia heart rate 57, otherwise stable. Blood work notable for hemoglobin 8.3 grams. Based on review of available office blood work report, hemoglobin on 08/21/2016 was 9 grams. Sodium 133, creatinine 1.17.  BUN normal. TRH asked to admit for further evaluation. GI consulted.  PRBC's have been ordered but not yet given.   Past Medical History:  Diagnosis Date  . Alzheimer's dementia   . Coronary artery disease   . DDD (degenerative disc disease)   . Diabetes mellitus, type 2 ([place])   .  Diverticulosis of colon   . Esophageal stricture   . GERD (gastroesophageal reflux disease)   . Heart attack ([place]) 1980   no trouble since  . Hemorrhoids   . Hyperlipidemia   . Hypertension   . OAB (overactive bladder)     Past Surgical History:  Procedure Laterality Date  . APPENDECTOMY    . BACK SURGERY    . BREAST BIOPSY    . CHOLECYSTECTOMY    . left knee replacement    . TONSILLECTOMY AND ADENOIDECTOMY      Prior to Admission medications   Medication Sig Start Date End Date Taking? Authorizing Provider  aspirin EC 81 MG tablet Take 81 mg by mouth daily.   Yes [provider]  atenolol (TENORMIN) 50 MG tablet Take 50 mg by mouth daily.     Yes [provider]  ezetimibe (ZETIA) 10 MG tablet Take 10 mg by mouth at bedtime.    Yes [provider]  fluticasone (FLONASE) 50 MCG/ACT nasal spray Place 1-2 sprays into both nostrils daily as needed for rhinitis.    Yes [provider]  prednisoLONE acetate (PRED FORTE) 1 % ophthalmic suspension Place 1 drop into both eyes 4 (four) times daily.   Yes [provider]  rivastigmine (EXELON) 9.5 mg/24hr Place 9.5 mg onto the skin daily.   Yes [provider]  rosuvastatin (CRESTOR) 10 MG tablet Take 10 mg by mouth at bedtime.    Yes [provider]  valsartan (DIOVAN) 160 MG tablet Take 160 mg by mouth daily.  Yes [provider]    Current Facility-Administered Medications  Medication Dose Route Frequency Provider Last Rate Last Dose  . 0.9 %  sodium chloride infusion   Intravenous Once Theodis Blaze, MD      . Derrill Memo ON 08/23/2016] atenolol (TENORMIN) tablet 50 mg  50 mg Oral Daily Theodis Blaze, MD      . calcium carbonate (TUMS - dosed in mg elemental calcium) chewable tablet 200 mg of elemental calcium  1 tablet Oral Daily Theodis Blaze, MD      . ezetimibe (ZETIA) tablet 10 mg  10 mg Oral Daily Theodis Blaze, MD      . fluticasone [facility]) 50 MCG/ACT  nasal spray 1 spray  1 spray Each Nare Daily Theodis Blaze, MD      . hydrochlorothiazide (HYDRODIURIL) tablet 25 mg  25 mg Oral Daily Theodis Blaze, MD      . ondansetron [facility]) tablet 4 mg  4 mg Oral Q6H PRN Theodis Blaze, MD       Or  . ondansetron [facility]) injection 4 mg  4 mg Intravenous Q6H PRN Theodis Blaze, MD      . Derrill Memo ON 08/23/2016] rosuvastatin (CRESTOR) tablet 5 mg  5 mg Oral Once per day on Mon Wed Fri Myers, Iskra M, MD      . sodium chloride flush (NS) 0.9 % injection 3 mL  3 mL Intravenous Q12H Theodis Blaze, MD        Allergies as of 08/22/2016 - Review Complete 08/22/2016  Allergen Reaction Noted  . Penicillins Other (See Comments)   . Sulfonamide derivatives Other (See Comments)     Family History  Problem Relation Age of Onset  . Heart disease Mother   . Cancer Father   . Diabetes Brother   . Hypertension Brother     Social History   Social History  . Marital status: Married    Spouse name: N/A  . Number of children: 2  . Years of education: N/A   Occupational History  . Retired Pharmacist, hospital    Social History Main Topics  . Smoking status: Never Smoker  . Smokeless tobacco: Never Used  . Alcohol use No     Comment: occasionally drinks wine  . Drug use: No  . Sexual activity: Not on file   Other Topics Concern  . Not on file   Social History Narrative  . No narrative on file    Review of Systems: ROS is O/W negative except as mentioned in HPI.  Physical Exam: Vital signs in last 24 hours: Temp:  [98.7 F (37.1 C)] 98.7 F (37.1 C) (05/15 1200) Pulse Rate:  [57-59] 59 (05/15 1200) Resp:  [17-18] 18 (05/15 1200) BP: (116-123)/(69-90) 123/69 (05/15 1200) SpO2:  [100 %] 100 % (05/15 1200)   General:  Alert, Well-developed, well-nourished, pleasant and cooperative in NAD Head:  Normocephalic and atraumatic. Eyes:  Sclera clear, no icterus.  Conjunctiva pink. Ears:  Normal auditory acuity. Mouth:  No deformity or lesions.   Lungs:   Clear throughout to auscultation.  No wheezes, crackles, or rhonchi.  No increased WOB. Heart:  Regular rate and rhythm; no murmurs, clicks, rubs, or gallops. Abdomen:  Soft, non-distended.  BS present.  Non-tender. Rectal:  Will be done at the time of colonoscopy.  Msk:  Symmetrical without gross deformities. Pulses:  Normal pulses noted. Extremities:  Without clubbing or edema. Neurologic:  Alert and oriented x 4;  grossly  normal neurologically. Skin:  Intact without significant lesions or rashes. Psych:  Alert and cooperative. Normal mood and affect.  Lab Results:  Recent Labs  08/22/16 1020  WBC 7.1  HGB 8.3*  HCT 24.0*  PLT 159   BMET  Recent Labs  08/22/16 1020  NA 133*  K 3.8  CL 102  CO2 23  GLUCOSE 153*  BUN 17  CREATININE 1.17*  CALCIUM 8.3*   LFT  Recent Labs  08/22/16 1020  PROT 5.7*  ALBUMIN 3.4*  AST 26  ALT 20  ALKPHOS 42  BILITOT 0.7   IMPRESSION:  -Lower GI bleed:  Suspect diverticular bleed vs AVM's, which were both seen on 12/2012 colonoscopy. -ABLA:  Hgb 8.3 grams.  Reported normal around 12 grams.  PRBC's ordered since patient is symptomatic but they have not been given yet.  PLAN: -Agree with transfusion.  Monitor Hgb. -Discussed observation vs colonoscopy to see if there are AVM's that can be treated.  Patient and daughter would like to proceed with colonoscopy.  Will schedule with Dr. Ardis Hughs for 5/16.   ZEHR, JESSICA D.  08/22/2016, 12:36 PM  Pager number [phone]  ________________________________________________________________________  Velora Heckler GI MD note:  I personally examined the patient, reviewed the data and agree with the assessment and plan described above.  Painless hematochezia, known diverticulosis and right sided AVMs.  Hemodynamically stable. Planning on colonoscopy tomorrow. If bleeding diverticulum is noted will treat, if not then will ablate right sided AVMs.   Owens Loffler, MD [facility] Gastroenterology Pager  [phone]

## 2016-08-23 NOTE — Op Note (Addendum)
[facility] Patient Name: Janet Ward Procedure Date: 08/23/2016 MRN: [id number] Attending MD: Milus Banister , MD Date of Birth: 10/19/1931 CSN: [id number] Age: 83 Admit Type: Inpatient Procedure:                Colonoscopy Indications:              Hematochezia (colonoscopy 2014 found diverticulosis                            and right colon AVMs) Providers:                Milus Banister, MD, Elmer Ramp. Tilden Dome, RN, [employer], Technician, Heide Scales, CRNA Referring MD:              Medicines:                Monitored Anesthesia Care Complications:            No immediate complications. Estimated blood loss:                            None. Estimated Blood Loss:     Estimated blood loss: none. Procedure:                Pre-Anesthesia Assessment:                           - Prior to the procedure, a History and Physical                            was performed, and patient medications and                            allergies were reviewed. The patient's tolerance of                            previous anesthesia was also reviewed. The risks                            and benefits of the procedure and the sedation                            options and risks were discussed with the patient.                            All questions were answered, and informed consent                            was obtained. Prior Anticoagulants: The patient has                            taken no previous anticoagulant or antiplatelet                            agents. ASA  Grade Assessment: III - A patient with                            severe systemic disease. After reviewing the risks                            and benefits, the patient was deemed in                            satisfactory condition to undergo the procedure.                           After obtaining informed consent, the colonoscope                            was passed under direct  vision. Throughout the                            procedure, the patient's blood pressure, pulse, and                            oxygen saturations were monitored continuously. The                            EC-[id number] ([id number]) scope was introduced through                            the anus and advanced to the the cecum, identified                            by appendiceal orifice and ileocecal valve. The                            colonoscopy was performed without difficulty. The                            patient tolerated the procedure well. The quality                            of the bowel preparation was excellent. The                            ileocecal valve, appendiceal orifice, and rectum                            were photographed. Scope In: 10:35:17 AM Scope Out: 10:48:08 AM Scope Withdrawal Time: 0 hours 6 minutes 26 seconds  Total Procedure Duration: 0 hours 12 minutes 51 seconds  Findings:      There was no blood in the colon.      Multiple small and large-mouthed diverticula were found in the entire       colon.      External and internal hemorrhoids were found. The hemorrhoids were       medium-sized.      The exam was  otherwise without abnormality on direct and retroflexion       views. Impression:               - There was no blood in the colon. The previously                            described right sided colon AVMs were not visible                            on this examination. I suspect her bleeding was                            from pan-colonic diverticulosis and it has clearly                            stopped.                           - External and internal hemorrhoids. Moderate Sedation:      N/A- Per Anesthesia Care Recommendation:           - Patient has a contact number available for                            emergencies. The signs and symptoms of potential                            delayed complications were discussed with the                             patient. Return to normal activities tomorrow.                            Written discharge instructions were provided to the                            patient.                           - Resume previous diet.                           - OK for discharge later today. She should take                            iron supplement (OTC one pill once daily for the                            next 3 months).                           -  GI will arrange repeat CBC in 2 weeks.                           - Continue present medications.                           -  No repeat colonoscopy. Procedure Code(s):        --- Professional ---                           [phone], Colonoscopy, flexible; diagnostic, including                            collection of specimen(s) by brushing or washing,                            when performed (separate procedure) Diagnosis Code(s):        --- Professional ---                           K64.8, Other hemorrhoids                           K92.1, Melena (includes Hematochezia)                           K57.30, Diverticulosis of large intestine without                            perforation or abscess without bleeding CPT copyright 2016 [employer]. All rights reserved. The codes documented in this report are preliminary and upon coder review may  be revised to meet current compliance requirements. Milus Banister, MD 08/23/2016 10:52:06 AM This report has been signed electronically. Number of Addenda: 0

## 2016-08-23 NOTE — Transfer of Care (Signed)
Immediate Anesthesia Transfer of Care Note  Patient: Janet Ward  Procedure(s) Performed: Procedure(s): COLONOSCOPY WITH PROPOFOL (N/A)  Patient Location: PACU  Anesthesia Type:MAC  Level of Consciousness:  sedated, patient cooperative and responds to stimulation  Airway & Oxygen Therapy:Patient Spontanous Breathing and Patient connected to face mask oxgen  Post-op Assessment:  Report given to PACU RN and Post -op Vital signs reviewed and stable  Post vital signs:  Reviewed and stable  Last Vitals:  Vitals:   08/23/16 0930 08/23/16 1054  BP: (!) 132/41 (!) 107/38  Pulse: 82 68  Resp: 18 11  Temp: 36.7 C 43.3 C    Complications: No apparent anesthesia complications

## 2016-08-23 NOTE — Interval H&P Note (Signed)
History and Physical Interval Note:  08/23/2016 9:40 AM  Janet Ward  has presented today for surgery, with the diagnosis of Lower GI bleed  The various methods of treatment have been discussed with the patient and family. After consideration of risks, benefits and other options for treatment, the patient has consented to  Procedure(s): COLONOSCOPY WITH PROPOFOL (N/A) as a surgical intervention .  The patient's history has been reviewed, patient examined, no change in status, stable for surgery.  I have reviewed the patient's chart and labs.  Questions were answered to the patient's satisfaction.     Milus Banister

## 2016-08-24 ENCOUNTER — Encounter (HOSPITAL_COMMUNITY): Payer: Self-pay | Admitting: Gastroenterology

## 2016-08-26 LAB — URINE CULTURE: Culture: NO GROWTH

## 2016-09-11 ENCOUNTER — Telehealth: Payer: Self-pay | Admitting: Gastroenterology

## 2016-09-11 NOTE — Telephone Encounter (Signed)
I spoke with the pt's daughter and she states the pt will see dr Joylene Draft in august and will have the labs faxed to our office and she will call back and set up follow up with Dr Henrene Pastor .

## 2016-09-11 NOTE — Telephone Encounter (Signed)
Outside labs  09/06/2016 CBC: Hb 9.4 which is up 1 point since d/c last month (admitted with overt hematochezia, presumed diverticular).  Please call her.  She should continue on iron once daily for now.  Repeat CBC in 2 months with rov (Dr. Henrene Pastor or extender) shortly afterwards.

## 2018-11-15 IMAGING — MR MR LUMBAR SPINE W/O CM
4 of 5 series · 19 of 48 positions shown · non-contrast
Comparison: CT scan 07/18/2012

CLINICAL DATA: Low back pain for 2 months. History of surgery 20
years ago.

EXAM:
MRI LUMBAR SPINE WITHOUT CONTRAST
TECHNIQUE: Multiplanar, multisequence MR imaging of the lumbar spine was
performed. No intravenous contrast was administered.

[Series 6: T2 · sagittal · 4.0mm · 0.75mm/px · 7 of 15 slices shown (1 of 2)]
[im 1/15]
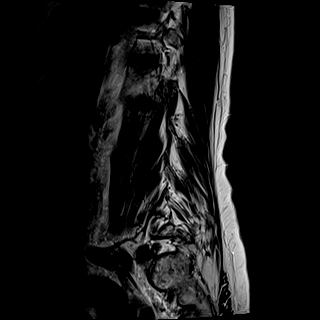
[im 3/15]
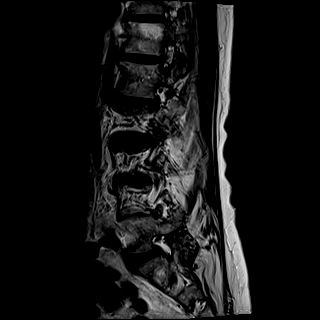
[im 5/15]
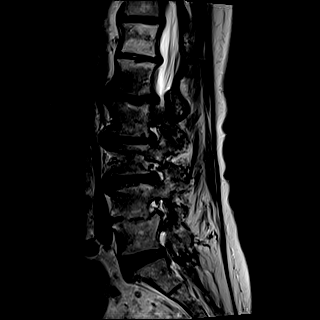
[im 8/15]
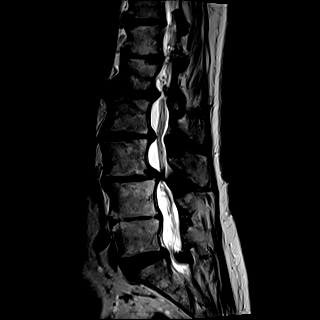
[im 10/15]
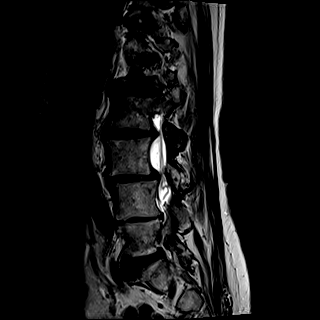
[im 12/15]
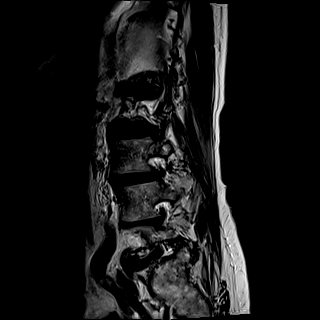
[im 15/15]
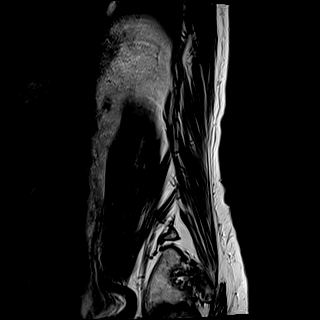

[Series 7: T1 · sagittal · 4.0mm · 0.75mm/px · 3 of 15 slices shown (1 of 2)]
[im 3/15]
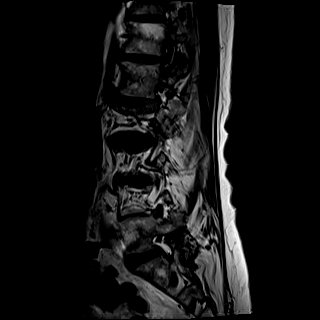
[im 8/15]
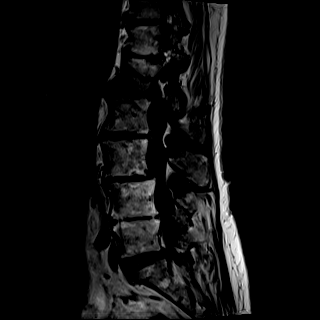
[im 12/15]
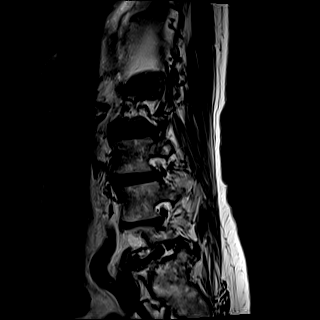

[Series 11: T1 · axial · 4.0mm · 0.28mm/px · z∈[-48,+66]mm · 3 of 34 slices shown (2 of 2)]
[im 6/34]
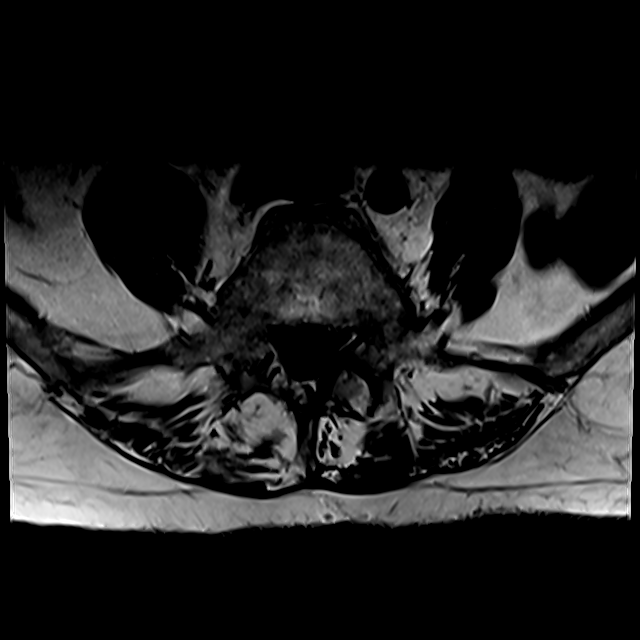
[im 18/34]
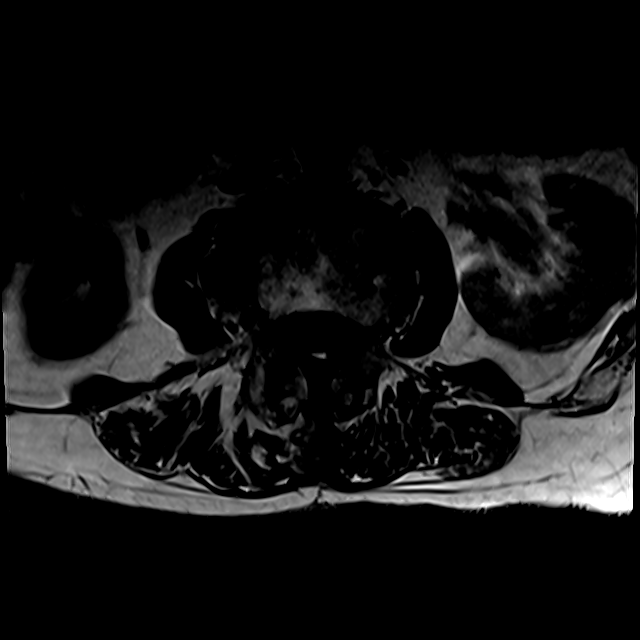
[im 28/34]
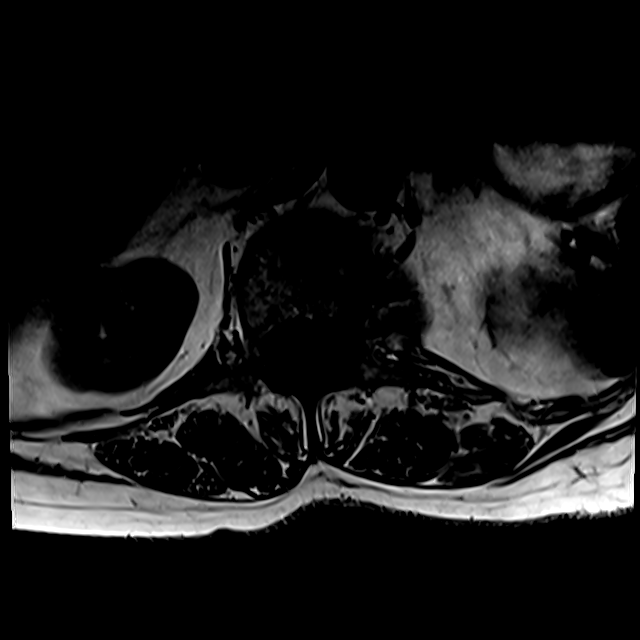

[Series 14: T2 · axial · 4.0mm · 0.28mm/px · z∈[-72,+66]mm · 6 of 34 slices shown (2 of 2)]
[im 1/34]
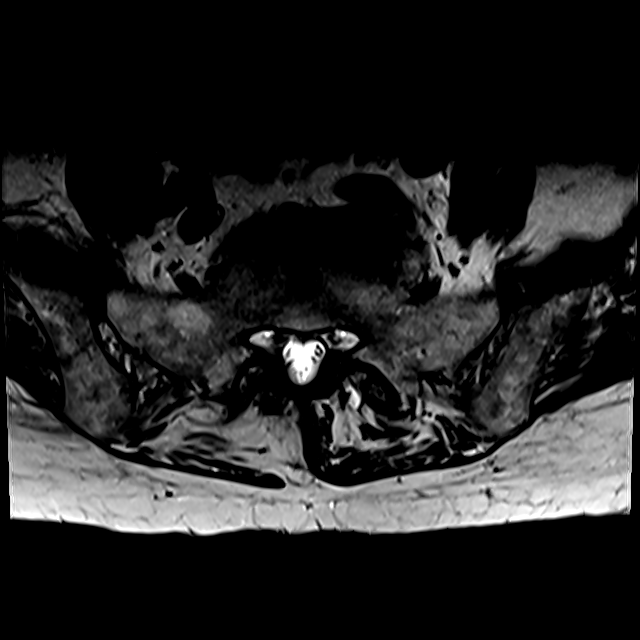
[im 6/34]
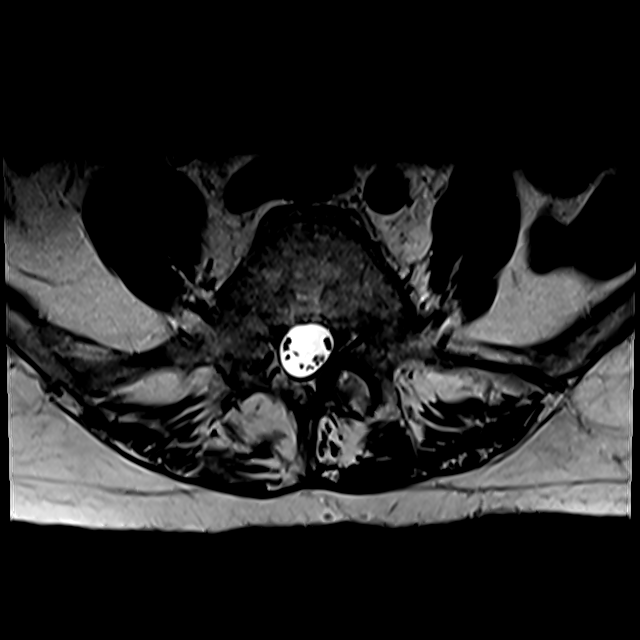
[im 11/34]
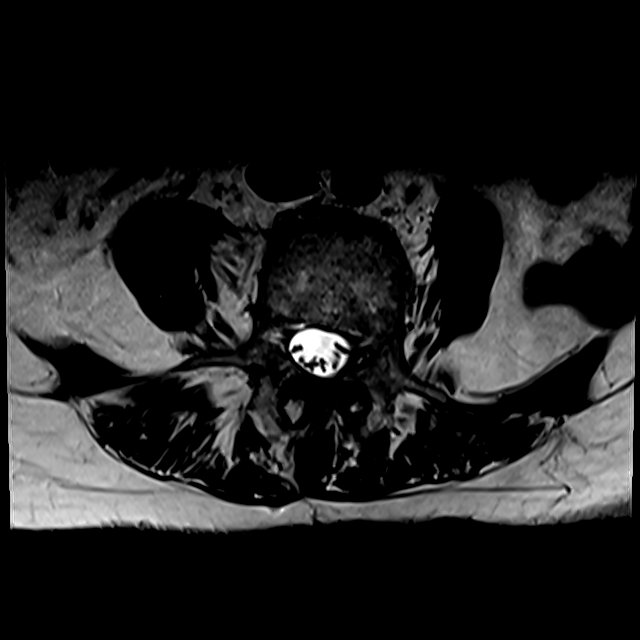
[im 16/34]
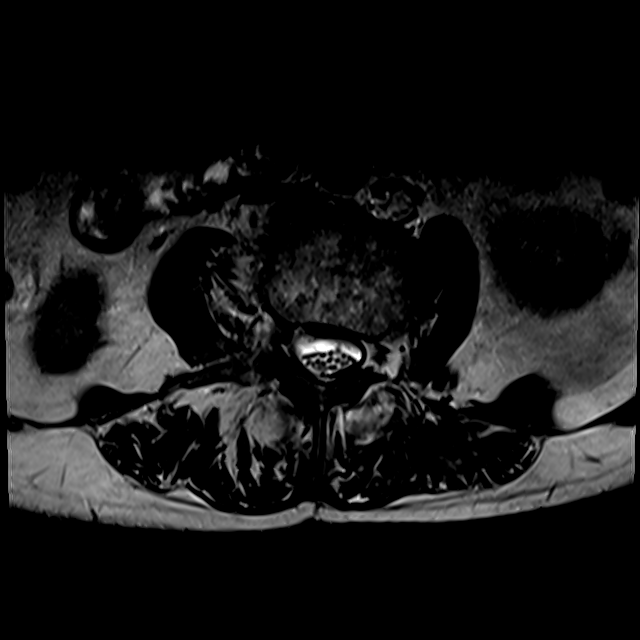
[im 18/34]
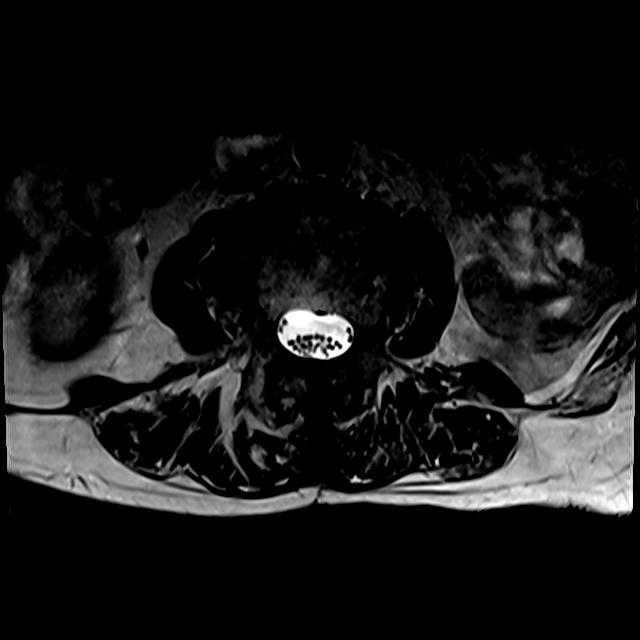
[im 28/34]
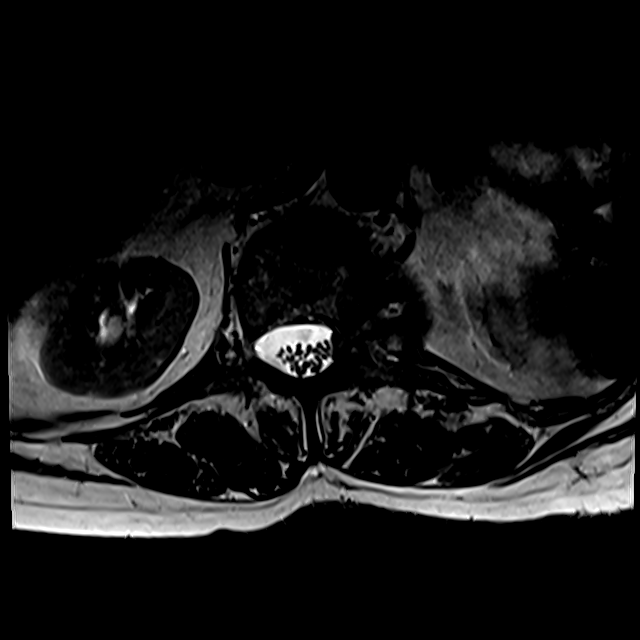

[19 of 48 positions shown; findings below may reference images not displayed]

FINDINGS: Segmentation: 5 lumbar type vertebral bodies. The last full
intervertebral disc space is labeled L5-S1. This correlates with
prior CT scan.

Alignment: Advanced degenerative lumbar spondylosis. Degenerative
anterolisthesis of L2 and L3.

Vertebrae: Multilevel endplate reactive changes but no bone lesions
or fracture.

Conus medullaris: Extends to the T12-L1 level and appears normal.

Paraspinal and other soft tissues: No significant findings. Mild
common bile duct dilatation due to prior cholecystectomy. No
aneurysm or retroperitoneal adenopathy. Renal cysts are noted.

Disc levels:

T11-12: Broad-based disc protrusion with flattening of the ventral
thecal sac.

T12-L1: Broad-based central and left paracentral disc protrusion
along with left-sided facet disease creating left lateral recess
stenosis.

L1-2: Diffuse bulging degenerated annulus and moderate facet disease
contributing to mild spinal and bilateral lateral recess stenosis.
There is also a broad-based extraforaminal and far lateral disc
protrusion and spurring changes likely effecting the extraforaminal
L1 nerve root. Small focus of herniated fat in the left lateral
recess versus a proteinaceous synovial cyst.

L2-3: Diffuse bulging degenerated annulus, osteophytic ridging,
short pedicles and facet disease contributing to moderate spinal and
bilateral lateral recess stenosis and mild bilateral foraminal
stenosis.

L3-4: Bulging uncovered disc, spurring changes, short pedicles and
advanced facet disease contributing to moderately severe spinal and
bilateral lateral recess stenosis. No significant foraminal
stenosis.

L4-5: Advanced degenerative disc disease and facet disease.
Right-sided laminectomy defect without spinal or foraminal stenosis.

L5-S1: Broad-based bulging annulus, osteophytic ridging, short
pedicles and severe facet disease contributing to moderately severe
spinal and bilateral lateral recess stenosis. There is also mild
left foraminal stenosis.
IMPRESSION: 1. Advanced degenerative of lumbar spondylosis with multilevel disc
disease and facet disease.
2. Multilevel multifactorial spinal, lateral recess and foraminal
stenosis as discussed above at the individual levels. Findings are
most significant at L2-3, L3-4 and L5-S1.
3. Postoperative changes at L4-5 without recurrent disc protrusion,
spinal or foraminal stenosis.

## 2018-12-02 IMAGING — XA Imaging study
2 series · 2 of 2 positions shown · non-contrast
Comparison: none

CLINICAL DATA: Lumbosacral spondylosis without myelopathy. Left low
back pain, occasionally radiating into the left lower extremity. MRI
demonstrates prior posterior decompression at L4-5 with varying
degrees of spinal, lateral recess, and neural foraminal stenosis at
all of the other lumbar levels.

[Series 1: ortho standard · 1 of 1 slices shown (1 of 2)]
[im 1/1]
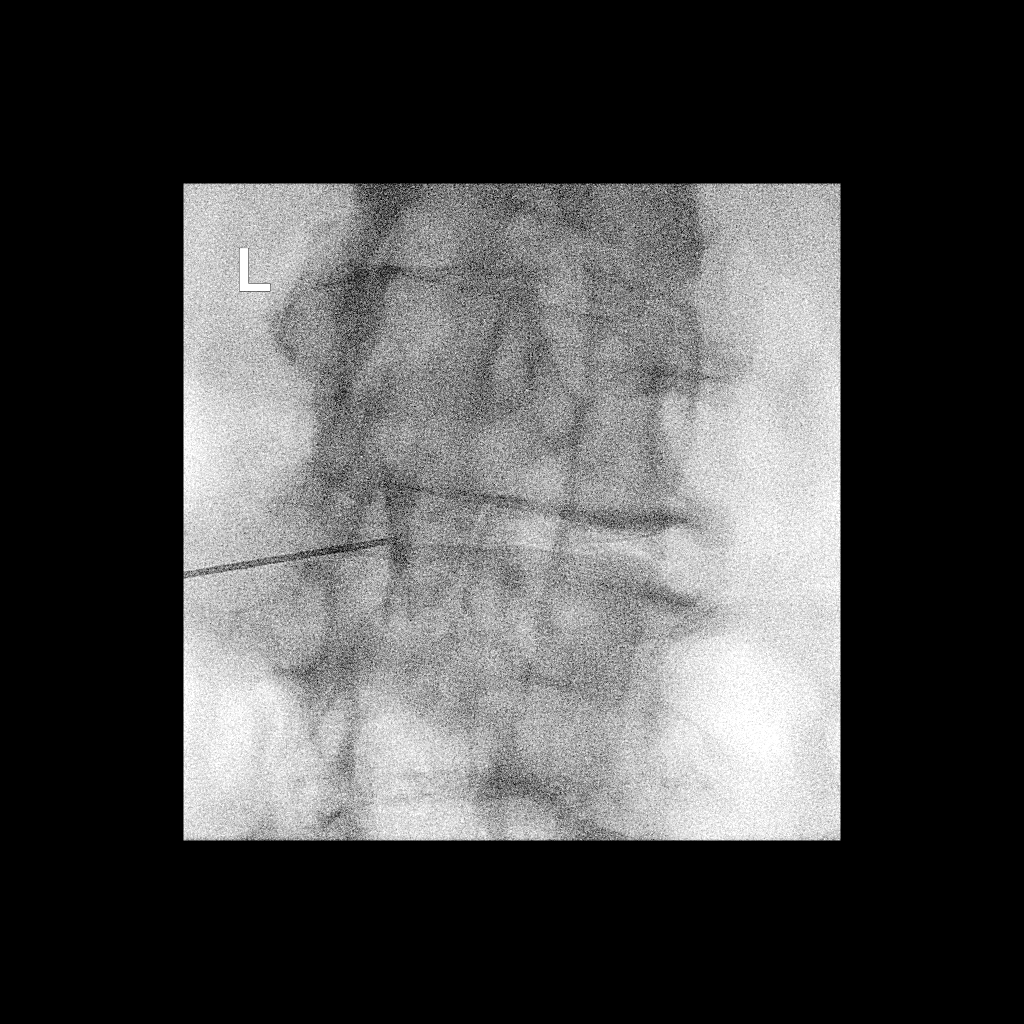

[Series 2: ortho standard · 1 of 1 slices shown (2 of 2)]
[im 1/1]
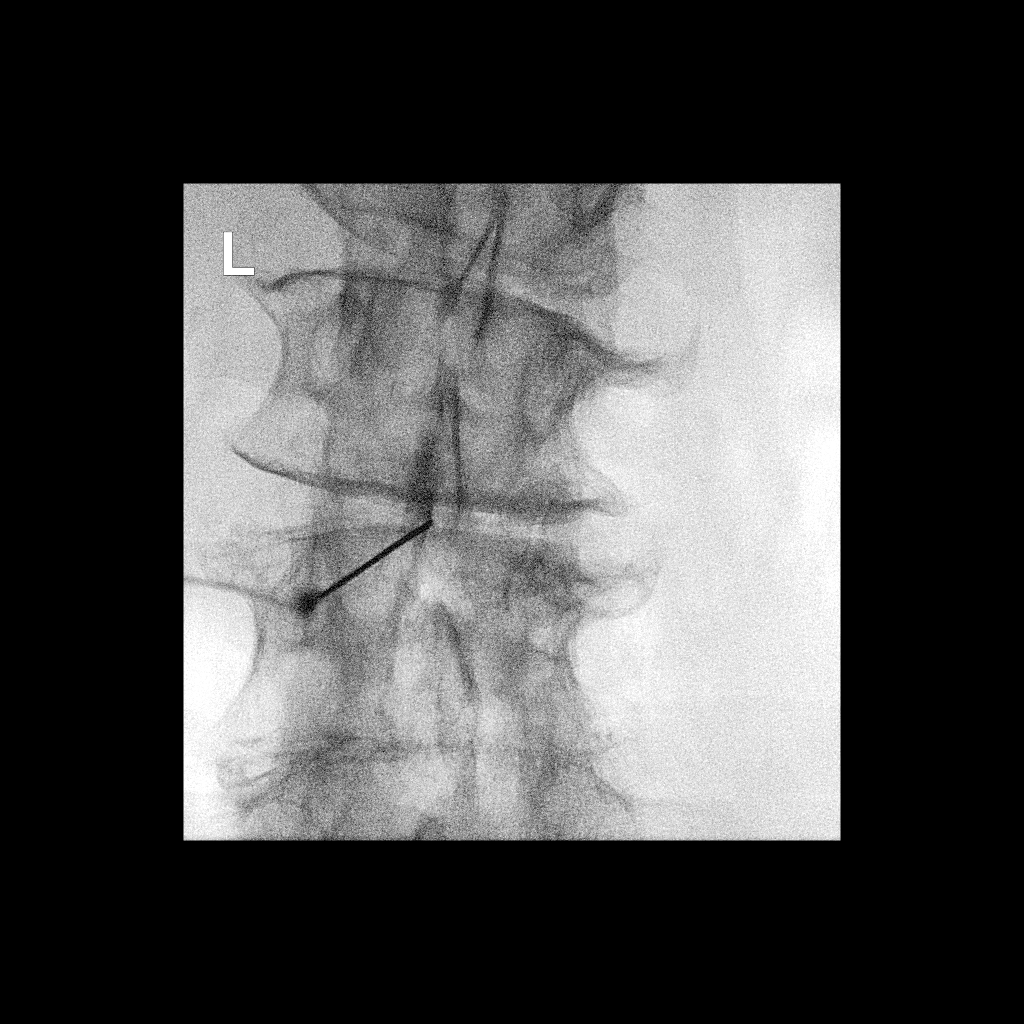

[2 of 2 positions shown; findings below may reference images not displayed]

FLUOROSCOPY TIME:  Radiation Exposure Index (as provided by the
fluoroscopic device): 10.52 microGray*m^2

Fluoroscopy Time (in minutes and seconds):  8 seconds

PROCEDURE:
The procedure, risks, benefits, and alternatives were explained to
the patient. Questions regarding the procedure were encouraged and
answered. The patient understands and consents to the procedure.

LUMBAR EPIDURAL INJECTION:

An interlaminar approach was performed on the left at L3-4. The
overlying skin was cleansed and anesthetized. A 3.5 inch 20 gauge
epidural needle was advanced using loss-of-resistance technique.

DIAGNOSTIC EPIDURAL INJECTION:

Injection of Isovue-M 200 shows a good epidural pattern with spread
above and below the level of needle placement, primarily on the
left. No vascular opacification is seen.

THERAPEUTIC EPIDURAL INJECTION:

120 mg of Depo-Medrol mixed with 3 mL of 1% lidocaine were
instilled. The procedure was well-tolerated, and the patient was
discharged thirty minutes following the injection in good condition.

COMPLICATIONS:
None
IMPRESSION: Technically successful lumbar interlaminar epidural injection on the
left at L3-4.
